# Patient Record
Sex: Female | Born: 1935 | Race: Black or African American | Hispanic: No | Marital: Married | Smoking: Never smoker
Health system: Southern US, Community
[De-identification: ages and names within clinical notes are randomized; demographics above are authoritative.]

## PROBLEM LIST (undated history)

## (undated) DIAGNOSIS — I10 Essential (primary) hypertension: Secondary | ICD-10-CM

## (undated) DIAGNOSIS — I5022 Chronic systolic (congestive) heart failure: Secondary | ICD-10-CM

## (undated) DIAGNOSIS — R809 Proteinuria, unspecified: Secondary | ICD-10-CM

## (undated) DIAGNOSIS — M8580 Other specified disorders of bone density and structure, unspecified site: Secondary | ICD-10-CM

## (undated) DIAGNOSIS — I482 Chronic atrial fibrillation, unspecified: Secondary | ICD-10-CM

## (undated) DIAGNOSIS — E559 Vitamin D deficiency, unspecified: Secondary | ICD-10-CM

## (undated) DIAGNOSIS — D869 Sarcoidosis, unspecified: Secondary | ICD-10-CM

## (undated) DIAGNOSIS — E278 Other specified disorders of adrenal gland: Secondary | ICD-10-CM

## (undated) DIAGNOSIS — J45909 Unspecified asthma, uncomplicated: Secondary | ICD-10-CM

## (undated) HISTORY — PX: EYE SURGERY: SHX253

## (undated) HISTORY — DX: Vitamin D deficiency, unspecified: E55.9

## (undated) HISTORY — DX: Essential (primary) hypertension: I10

## (undated) HISTORY — PX: ABDOMINAL HYSTERECTOMY: SHX81

## (undated) HISTORY — DX: Unspecified asthma, uncomplicated: J45.909

## (undated) HISTORY — DX: Other specified disorders of adrenal gland: E27.8

## (undated) HISTORY — DX: Sarcoidosis, unspecified: D86.9

## (undated) HISTORY — DX: Other specified disorders of bone density and structure, unspecified site: M85.80

## (undated) HISTORY — DX: Proteinuria, unspecified: R80.9

---

## 2003-11-02 ENCOUNTER — Other Ambulatory Visit: Payer: Self-pay

## 2009-12-24 ENCOUNTER — Ambulatory Visit: Payer: Self-pay | Admitting: Family Medicine

## 2011-02-06 ENCOUNTER — Ambulatory Visit: Payer: Self-pay | Admitting: Family Medicine

## 2012-02-09 ENCOUNTER — Ambulatory Visit: Payer: Self-pay | Admitting: Family Medicine

## 2012-07-15 ENCOUNTER — Inpatient Hospital Stay: Payer: Self-pay | Admitting: Internal Medicine

## 2012-07-15 LAB — CK TOTAL AND CKMB (NOT AT ARMC)
CK, Total: 54 U/L (ref 21–215)
CK-MB: <0.5 ng/mL — ABNORMAL LOW (ref 0.5–3.6)

## 2012-07-15 LAB — COMPREHENSIVE METABOLIC PANEL
Albumin: 4.4 g/dL (ref 3.4–5.0)
Anion Gap: 5 — ABNORMAL LOW (ref 7–16)
Calcium, Total: 9.5 mg/dL (ref 8.5–10.1)
Chloride: 105 mmol/L (ref 98–107)
Co2: 28 mmol/L (ref 21–32)
Creatinine: 0.97 mg/dL (ref 0.60–1.30)
EGFR (Non-African Amer.): 57 — ABNORMAL LOW
Glucose: 141 mg/dL — ABNORMAL HIGH (ref 65–99)
Potassium: 3.8 mmol/L (ref 3.5–5.1)
SGOT(AST): 28 U/L (ref 15–37)
SGPT (ALT): 31 U/L (ref 12–78)

## 2012-07-15 LAB — TSH: Thyroid Stimulating Horm: 2.39 u[IU]/mL

## 2012-07-15 LAB — PRO B NATRIURETIC PEPTIDE: B-Type Natriuretic Peptide: 1693 pg/mL — ABNORMAL HIGH (ref 0–450)

## 2012-07-15 LAB — CBC
HGB: 14.9 g/dL (ref 12.0–16.0)
MCH: 29.9 pg (ref 26.0–34.0)
MCHC: 33.6 g/dL (ref 32.0–36.0)
MCV: 89 fL (ref 80–100)
RBC: 4.97 10*6/uL (ref 3.80–5.20)
RDW: 15 % — ABNORMAL HIGH (ref 11.5–14.5)
WBC: 10.1 10*3/uL (ref 3.6–11.0)

## 2012-07-16 LAB — CBC WITH DIFFERENTIAL/PLATELET
Basophil #: 0.1 10*3/uL (ref 0.0–0.1)
Basophil %: 0.6 %
Eosinophil #: 0.3 10*3/uL (ref 0.0–0.7)
HCT: 38.2 % (ref 35.0–47.0)
HGB: 12.8 g/dL (ref 12.0–16.0)
Lymphocyte #: 2.2 10*3/uL (ref 1.0–3.6)
MCHC: 33.5 g/dL (ref 32.0–36.0)
MCV: 88 fL (ref 80–100)
Neutrophil %: 60.7 %
RDW: 15 % — ABNORMAL HIGH (ref 11.5–14.5)
WBC: 9.9 10*3/uL (ref 3.6–11.0)

## 2012-07-16 LAB — LIPID PANEL
HDL Cholesterol: 41 mg/dL (ref 40–60)
Ldl Cholesterol, Calc: 71 mg/dL (ref 0–100)
Triglycerides: 104 mg/dL (ref 0–200)
VLDL Cholesterol, Calc: 21 mg/dL (ref 5–40)

## 2012-07-17 LAB — BASIC METABOLIC PANEL
Anion Gap: 3 — ABNORMAL LOW (ref 7–16)
BUN: 22 mg/dL — ABNORMAL HIGH (ref 7–18)
Calcium, Total: 8.9 mg/dL (ref 8.5–10.1)
Chloride: 98 mmol/L (ref 98–107)
Co2: 34 mmol/L — ABNORMAL HIGH (ref 21–32)
Creatinine: 1.05 mg/dL (ref 0.60–1.30)

## 2013-01-13 ENCOUNTER — Ambulatory Visit: Payer: Self-pay | Admitting: Family Medicine

## 2013-02-09 ENCOUNTER — Ambulatory Visit: Payer: Self-pay | Admitting: Family Medicine

## 2013-03-01 ENCOUNTER — Ambulatory Visit: Payer: Self-pay | Admitting: Family Medicine

## 2013-05-01 ENCOUNTER — Emergency Department: Payer: Self-pay | Admitting: Emergency Medicine

## 2013-05-01 LAB — BASIC METABOLIC PANEL
Anion Gap: 5 — ABNORMAL LOW (ref 7–16)
Calcium, Total: 9.5 mg/dL (ref 8.5–10.1)
Chloride: 105 mmol/L (ref 98–107)
Co2: 27 mmol/L (ref 21–32)
Creatinine: 0.87 mg/dL (ref 0.60–1.30)
EGFR (African American): >60
EGFR (Non-African Amer.): >60
Glucose: 118 mg/dL — ABNORMAL HIGH (ref 65–99)
Sodium: 137 mmol/L (ref 136–145)

## 2013-05-01 LAB — CBC
HGB: 14.1 g/dL (ref 12.0–16.0)
MCH: 29.6 pg (ref 26.0–34.0)
MCHC: 33.2 g/dL (ref 32.0–36.0)
MCV: 89 fL (ref 80–100)
WBC: 11.3 10*3/uL — ABNORMAL HIGH (ref 3.6–11.0)

## 2013-11-08 ENCOUNTER — Inpatient Hospital Stay: Payer: Self-pay | Admitting: Internal Medicine

## 2013-11-08 LAB — CBC
HCT: 38.9 % (ref 35.0–47.0)
HGB: 12.5 g/dL (ref 12.0–16.0)
MCH: 28 pg (ref 26.0–34.0)
MCHC: 32 g/dL (ref 32.0–36.0)
MCV: 88 fL (ref 80–100)
Platelet: 249 10*3/uL (ref 150–440)
RBC: 4.45 10*6/uL (ref 3.80–5.20)
RDW: 14.4 % (ref 11.5–14.5)
WBC: 23 10*3/uL — ABNORMAL HIGH (ref 3.6–11.0)

## 2013-11-08 LAB — PRO B NATRIURETIC PEPTIDE: B-Type Natriuretic Peptide: 2982 pg/mL — ABNORMAL HIGH (ref 0–450)

## 2013-11-08 LAB — TROPONIN I: Troponin-I: <0.02 ng/mL

## 2013-11-08 LAB — BASIC METABOLIC PANEL
Anion Gap: 8 (ref 7–16)
BUN: 21 mg/dL — ABNORMAL HIGH (ref 7–18)
CHLORIDE: 92 mmol/L — AB (ref 98–107)
Calcium, Total: 9 mg/dL (ref 8.5–10.1)
Co2: 26 mmol/L (ref 21–32)
Creatinine: 1.08 mg/dL (ref 0.60–1.30)
EGFR (African American): 57 — ABNORMAL LOW
EGFR (Non-African Amer.): 49 — ABNORMAL LOW
Glucose: 240 mg/dL — ABNORMAL HIGH (ref 65–99)
Osmolality: 264 (ref 275–301)
POTASSIUM: 3.6 mmol/L (ref 3.5–5.1)
SODIUM: 126 mmol/L — AB (ref 136–145)

## 2013-11-08 LAB — CK TOTAL AND CKMB (NOT AT ARMC): CK, Total: 70 U/L

## 2013-11-09 LAB — URINALYSIS, COMPLETE
Bilirubin,UR: NEGATIVE
GLUCOSE, UR: NEGATIVE mg/dL (ref 0–75)
KETONE: NEGATIVE
LEUKOCYTE ESTERASE: NEGATIVE
Nitrite: NEGATIVE
Ph: 5 (ref 4.5–8.0)
Protein: NEGATIVE
RBC,UR: 2 /HPF (ref 0–5)
Specific Gravity: 1.011 (ref 1.003–1.030)
Squamous Epithelial: 1

## 2013-11-09 LAB — TROPONIN I: Troponin-I: <0.02 ng/mL

## 2013-11-10 LAB — BASIC METABOLIC PANEL
Anion Gap: 7 (ref 7–16)
BUN: 36 mg/dL — AB (ref 7–18)
CALCIUM: 9 mg/dL (ref 8.5–10.1)
Chloride: 90 mmol/L — ABNORMAL LOW (ref 98–107)
Co2: 33 mmol/L — ABNORMAL HIGH (ref 21–32)
Creatinine: 1.11 mg/dL (ref 0.60–1.30)
EGFR (African American): 55 — ABNORMAL LOW
EGFR (Non-African Amer.): 48 — ABNORMAL LOW
Glucose: 112 mg/dL — ABNORMAL HIGH (ref 65–99)
Osmolality: 270 (ref 275–301)
POTASSIUM: 2.8 mmol/L — AB (ref 3.5–5.1)
SODIUM: 130 mmol/L — AB (ref 136–145)

## 2013-11-10 LAB — CBC WITH DIFFERENTIAL/PLATELET
Basophil #: 0.1 10*3/uL (ref 0.0–0.1)
Basophil %: 0.3 %
EOS ABS: 0.1 10*3/uL (ref 0.0–0.7)
EOS PCT: 0.5 %
HCT: 36.5 % (ref 35.0–47.0)
HGB: 12.2 g/dL (ref 12.0–16.0)
LYMPHS ABS: 1.6 10*3/uL (ref 1.0–3.6)
Lymphocyte %: 7.8 %
MCH: 30.1 pg (ref 26.0–34.0)
MCHC: 33.5 g/dL (ref 32.0–36.0)
MCV: 90 fL (ref 80–100)
MONO ABS: 2.5 x10 3/mm — AB (ref 0.2–0.9)
Monocyte %: 11.9 %
NEUTROS PCT: 79.5 %
Neutrophil #: 16.4 10*3/uL — ABNORMAL HIGH (ref 1.4–6.5)
Platelet: 253 10*3/uL (ref 150–440)
RBC: 4.07 10*6/uL (ref 3.80–5.20)
RDW: 14.3 % (ref 11.5–14.5)
WBC: 20.6 10*3/uL — ABNORMAL HIGH (ref 3.6–11.0)

## 2013-11-10 LAB — MAGNESIUM: Magnesium: 1.8 mg/dL

## 2013-11-11 LAB — CBC WITH DIFFERENTIAL/PLATELET
BASOS ABS: 0 10*3/uL (ref 0.0–0.1)
Basophil %: 0.1 %
EOS PCT: 0.8 %
Eosinophil #: 0.1 10*3/uL (ref 0.0–0.7)
HCT: 36.3 % (ref 35.0–47.0)
HGB: 11.9 g/dL — ABNORMAL LOW (ref 12.0–16.0)
LYMPHS PCT: 8.5 %
Lymphocyte #: 1.5 10*3/uL (ref 1.0–3.6)
MCH: 29.1 pg (ref 26.0–34.0)
MCHC: 32.7 g/dL (ref 32.0–36.0)
MCV: 89 fL (ref 80–100)
Monocyte #: 2.1 x10 3/mm — ABNORMAL HIGH (ref 0.2–0.9)
Monocyte %: 12.1 %
Neutrophil #: 13.7 10*3/uL — ABNORMAL HIGH (ref 1.4–6.5)
Neutrophil %: 78.5 %
PLATELETS: 286 10*3/uL (ref 150–440)
RBC: 4.08 10*6/uL (ref 3.80–5.20)
RDW: 14.1 % (ref 11.5–14.5)
WBC: 17.5 10*3/uL — AB (ref 3.6–11.0)

## 2013-11-11 LAB — BASIC METABOLIC PANEL
ANION GAP: 6 — AB (ref 7–16)
BUN: 34 mg/dL — ABNORMAL HIGH (ref 7–18)
CO2: 31 mmol/L (ref 21–32)
Calcium, Total: 8.7 mg/dL (ref 8.5–10.1)
Chloride: 94 mmol/L — ABNORMAL LOW (ref 98–107)
Creatinine: 0.97 mg/dL (ref 0.60–1.30)
GFR CALC NON AF AMER: 56 — AB
Glucose: 125 mg/dL — ABNORMAL HIGH (ref 65–99)
OSMOLALITY: 272 (ref 275–301)
POTASSIUM: 3.8 mmol/L (ref 3.5–5.1)
Sodium: 131 mmol/L — ABNORMAL LOW (ref 136–145)

## 2013-11-11 LAB — MAGNESIUM: MAGNESIUM: 2.3 mg/dL

## 2013-11-12 LAB — CBC WITH DIFFERENTIAL/PLATELET
Basophil #: 0 10*3/uL (ref 0.0–0.1)
Basophil %: 0.2 %
EOS PCT: 0.9 %
Eosinophil #: 0.1 10*3/uL (ref 0.0–0.7)
HCT: 36.1 % (ref 35.0–47.0)
HGB: 12.1 g/dL (ref 12.0–16.0)
LYMPHS ABS: 1.4 10*3/uL (ref 1.0–3.6)
Lymphocyte %: 8.5 %
MCH: 29.4 pg (ref 26.0–34.0)
MCHC: 33.6 g/dL (ref 32.0–36.0)
MCV: 87 fL (ref 80–100)
MONOS PCT: 11.2 %
Monocyte #: 1.8 x10 3/mm — ABNORMAL HIGH (ref 0.2–0.9)
NEUTROS ABS: 13 10*3/uL — AB (ref 1.4–6.5)
Neutrophil %: 79.2 %
Platelet: 283 10*3/uL (ref 150–440)
RBC: 4.13 10*6/uL (ref 3.80–5.20)
RDW: 14.2 % (ref 11.5–14.5)
WBC: 16.4 10*3/uL — ABNORMAL HIGH (ref 3.6–11.0)

## 2013-11-12 LAB — BASIC METABOLIC PANEL
ANION GAP: 7 (ref 7–16)
BUN: 33 mg/dL — ABNORMAL HIGH (ref 7–18)
Calcium, Total: 8.8 mg/dL (ref 8.5–10.1)
Chloride: 90 mmol/L — ABNORMAL LOW (ref 98–107)
Co2: 32 mmol/L (ref 21–32)
Creatinine: 0.85 mg/dL (ref 0.60–1.30)
GLUCOSE: 114 mg/dL — AB (ref 65–99)
OSMOLALITY: 267 (ref 275–301)
POTASSIUM: 3.9 mmol/L (ref 3.5–5.1)
Sodium: 129 mmol/L — ABNORMAL LOW (ref 136–145)

## 2013-11-13 LAB — CBC WITH DIFFERENTIAL/PLATELET
BASOS ABS: 0.1 10*3/uL (ref 0.0–0.1)
Basophil %: 0.3 %
Eosinophil #: 0.1 10*3/uL (ref 0.0–0.7)
Eosinophil %: 0.7 %
HCT: 38.4 % (ref 35.0–47.0)
HGB: 12.3 g/dL (ref 12.0–16.0)
LYMPHS PCT: 7.9 %
Lymphocyte #: 1.4 10*3/uL (ref 1.0–3.6)
MCH: 28.3 pg (ref 26.0–34.0)
MCHC: 32.1 g/dL (ref 32.0–36.0)
MCV: 88 fL (ref 80–100)
MONO ABS: 2 x10 3/mm — AB (ref 0.2–0.9)
MONOS PCT: 11.4 %
Neutrophil #: 13.9 10*3/uL — ABNORMAL HIGH (ref 1.4–6.5)
Neutrophil %: 79.7 %
Platelet: 347 10*3/uL (ref 150–440)
RBC: 4.35 10*6/uL (ref 3.80–5.20)
RDW: 14.2 % (ref 11.5–14.5)
WBC: 17.5 10*3/uL — ABNORMAL HIGH (ref 3.6–11.0)

## 2013-11-13 LAB — BASIC METABOLIC PANEL
Anion Gap: 4 — ABNORMAL LOW (ref 7–16)
BUN: 25 mg/dL — ABNORMAL HIGH (ref 7–18)
Calcium, Total: 9 mg/dL (ref 8.5–10.1)
Chloride: 90 mmol/L — ABNORMAL LOW (ref 98–107)
Co2: 33 mmol/L — ABNORMAL HIGH (ref 21–32)
Creatinine: 0.96 mg/dL (ref 0.60–1.30)
EGFR (African American): >60
EGFR (Non-African Amer.): 57 — ABNORMAL LOW
Glucose: 124 mg/dL — ABNORMAL HIGH (ref 65–99)
Osmolality: 261 (ref 275–301)
Potassium: 4.1 mmol/L (ref 3.5–5.1)
Sodium: 127 mmol/L — ABNORMAL LOW (ref 136–145)

## 2013-11-13 LAB — CULTURE, BLOOD (SINGLE)

## 2013-11-14 LAB — CBC WITH DIFFERENTIAL/PLATELET
Basophil #: 0.1 10*3/uL (ref 0.0–0.1)
Basophil %: 0.3 %
EOS PCT: 1.5 %
Eosinophil #: 0.3 10*3/uL (ref 0.0–0.7)
HCT: 35.9 % (ref 35.0–47.0)
HGB: 12 g/dL (ref 12.0–16.0)
Lymphocyte #: 1.2 10*3/uL (ref 1.0–3.6)
Lymphocyte %: 7 %
MCH: 29 pg (ref 26.0–34.0)
MCHC: 33.4 g/dL (ref 32.0–36.0)
MCV: 87 fL (ref 80–100)
MONO ABS: 2.5 x10 3/mm — AB (ref 0.2–0.9)
Monocyte %: 14.2 %
Neutrophil #: 13.6 10*3/uL — ABNORMAL HIGH (ref 1.4–6.5)
Neutrophil %: 77 %
PLATELETS: 328 10*3/uL (ref 150–440)
RBC: 4.14 10*6/uL (ref 3.80–5.20)
RDW: 14.2 % (ref 11.5–14.5)
WBC: 17.7 10*3/uL — ABNORMAL HIGH (ref 3.6–11.0)

## 2013-11-14 LAB — BASIC METABOLIC PANEL
Anion Gap: 7 (ref 7–16)
BUN: 23 mg/dL — ABNORMAL HIGH (ref 7–18)
CHLORIDE: 89 mmol/L — AB (ref 98–107)
CREATININE: 0.93 mg/dL (ref 0.60–1.30)
Calcium, Total: 8.7 mg/dL (ref 8.5–10.1)
Co2: 32 mmol/L (ref 21–32)
EGFR (African American): >60
EGFR (Non-African Amer.): 59 — ABNORMAL LOW
Glucose: 109 mg/dL — ABNORMAL HIGH (ref 65–99)
Osmolality: 261 (ref 275–301)
Potassium: 3.9 mmol/L (ref 3.5–5.1)
Sodium: 128 mmol/L — ABNORMAL LOW (ref 136–145)

## 2013-11-14 LAB — BODY FLUID CELL COUNT WITH DIFFERENTIAL
BASOS ABS: 0 %
EOS PCT: 0 %
LYMPHS PCT: 63 %
NUCLEATED CELL COUNT: 6212 /mm3
Neutrophils: 30 %
OTHER CELLS BF: 0 %
Other Mononuclear Cells: 7 %

## 2013-11-14 LAB — LACTATE DEHYDROGENASE, PLEURAL OR PERITONEAL FLUID: LDH, Body Fluid: 107 U/L

## 2013-11-14 LAB — MAGNESIUM: MAGNESIUM: 2.3 mg/dL

## 2013-11-15 LAB — CBC WITH DIFFERENTIAL/PLATELET
Basophil #: 0 10*3/uL (ref 0.0–0.1)
Basophil %: 0.2 %
Eosinophil #: 0.3 10*3/uL (ref 0.0–0.7)
Eosinophil %: 2 %
HCT: 38.4 % (ref 35.0–47.0)
HGB: 12 g/dL (ref 12.0–16.0)
Lymphocyte #: 1.2 10*3/uL (ref 1.0–3.6)
Lymphocyte %: 7 %
MCH: 27.6 pg (ref 26.0–34.0)
MCHC: 31.4 g/dL — AB (ref 32.0–36.0)
MCV: 88 fL (ref 80–100)
Monocyte #: 1.8 x10 3/mm — ABNORMAL HIGH (ref 0.2–0.9)
Monocyte %: 10.7 %
Neutrophil #: 13.3 10*3/uL — ABNORMAL HIGH (ref 1.4–6.5)
Neutrophil %: 80.1 %
Platelet: 359 10*3/uL (ref 150–440)
RBC: 4.36 10*6/uL (ref 3.80–5.20)
RDW: 14.3 % (ref 11.5–14.5)
WBC: 16.7 10*3/uL — ABNORMAL HIGH (ref 3.6–11.0)

## 2013-11-15 LAB — BASIC METABOLIC PANEL
Anion Gap: 6 — ABNORMAL LOW (ref 7–16)
BUN: 22 mg/dL — AB (ref 7–18)
CO2: 33 mmol/L — AB (ref 21–32)
CREATININE: 1.06 mg/dL (ref 0.60–1.30)
Calcium, Total: 8.8 mg/dL (ref 8.5–10.1)
Chloride: 88 mmol/L — ABNORMAL LOW (ref 98–107)
EGFR (African American): 59 — ABNORMAL LOW
GFR CALC NON AF AMER: 51 — AB
GLUCOSE: 207 mg/dL — AB (ref 65–99)
Osmolality: 265 (ref 275–301)
POTASSIUM: 3.9 mmol/L (ref 3.5–5.1)
Sodium: 127 mmol/L — ABNORMAL LOW (ref 136–145)

## 2013-11-18 LAB — BODY FLUID CULTURE

## 2014-01-02 ENCOUNTER — Ambulatory Visit: Payer: Self-pay | Admitting: Internal Medicine

## 2014-03-14 IMAGING — MG MM DIGITAL SCREENING BILAT W/ CAD
1 series · 4 of 4 positions shown · non-contrast
Comparison: Previous exam(s)

REASON FOR EXAM: SCR MAMMO NO ORDER
COMMENTS:

PROCEDURE:     MAM - MAM DGTL SCRN MAM NO ORDER W/CAD  - February 09, 2013 [DATE]
CLINICAL DATA: Screening.
EXAM:
DIGITAL SCREENING BILATERAL MAMMOGRAM WITH CAD

[R CC · right · 4 of 4 slices shown]
[im 1/4]
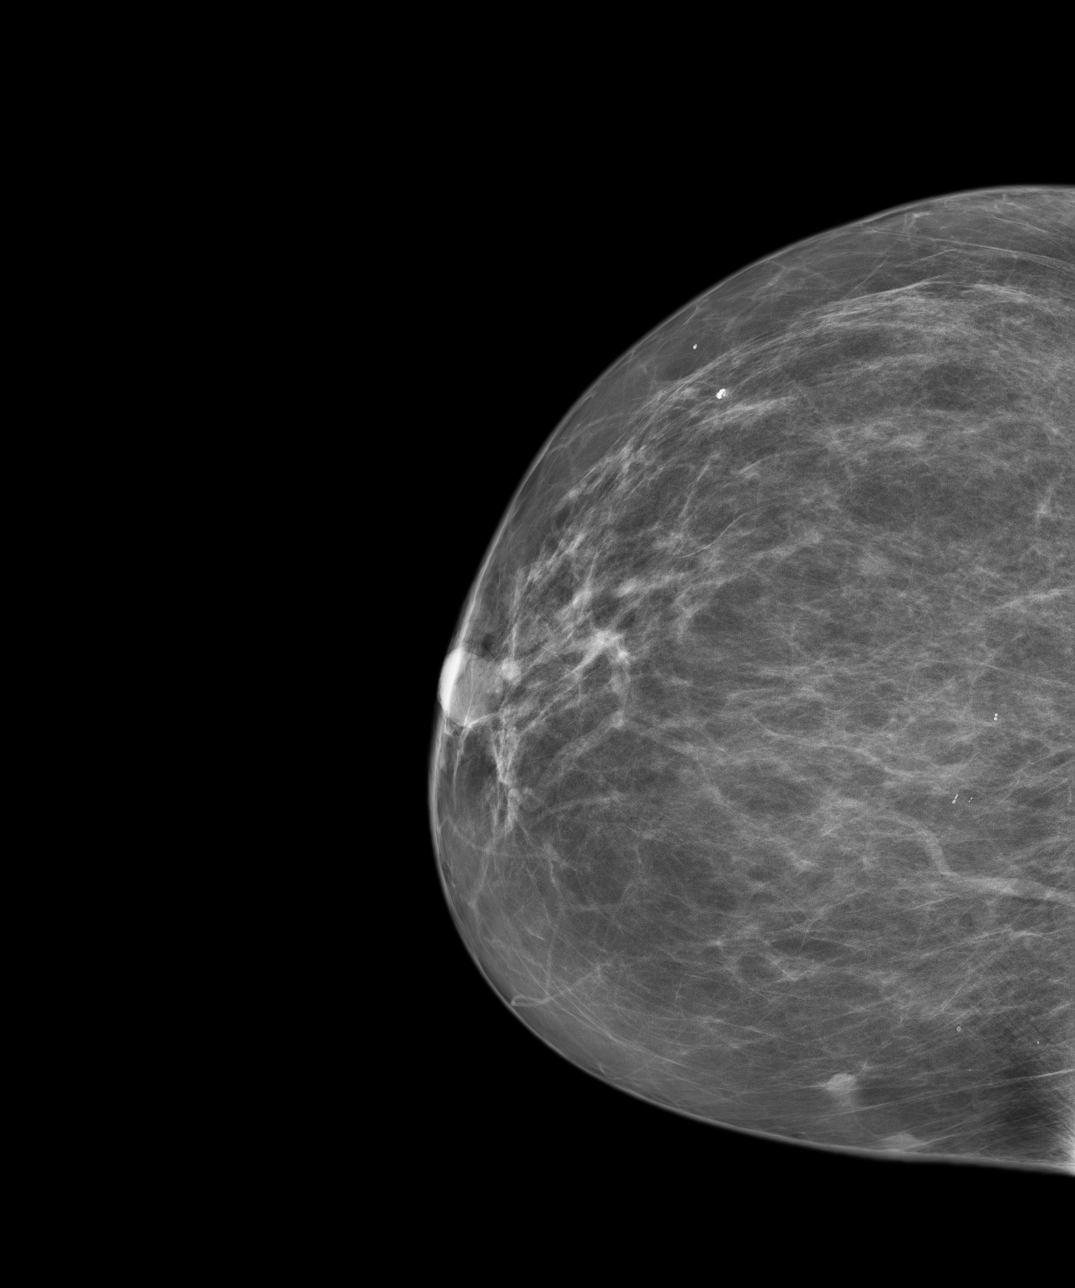
[im 2/4]
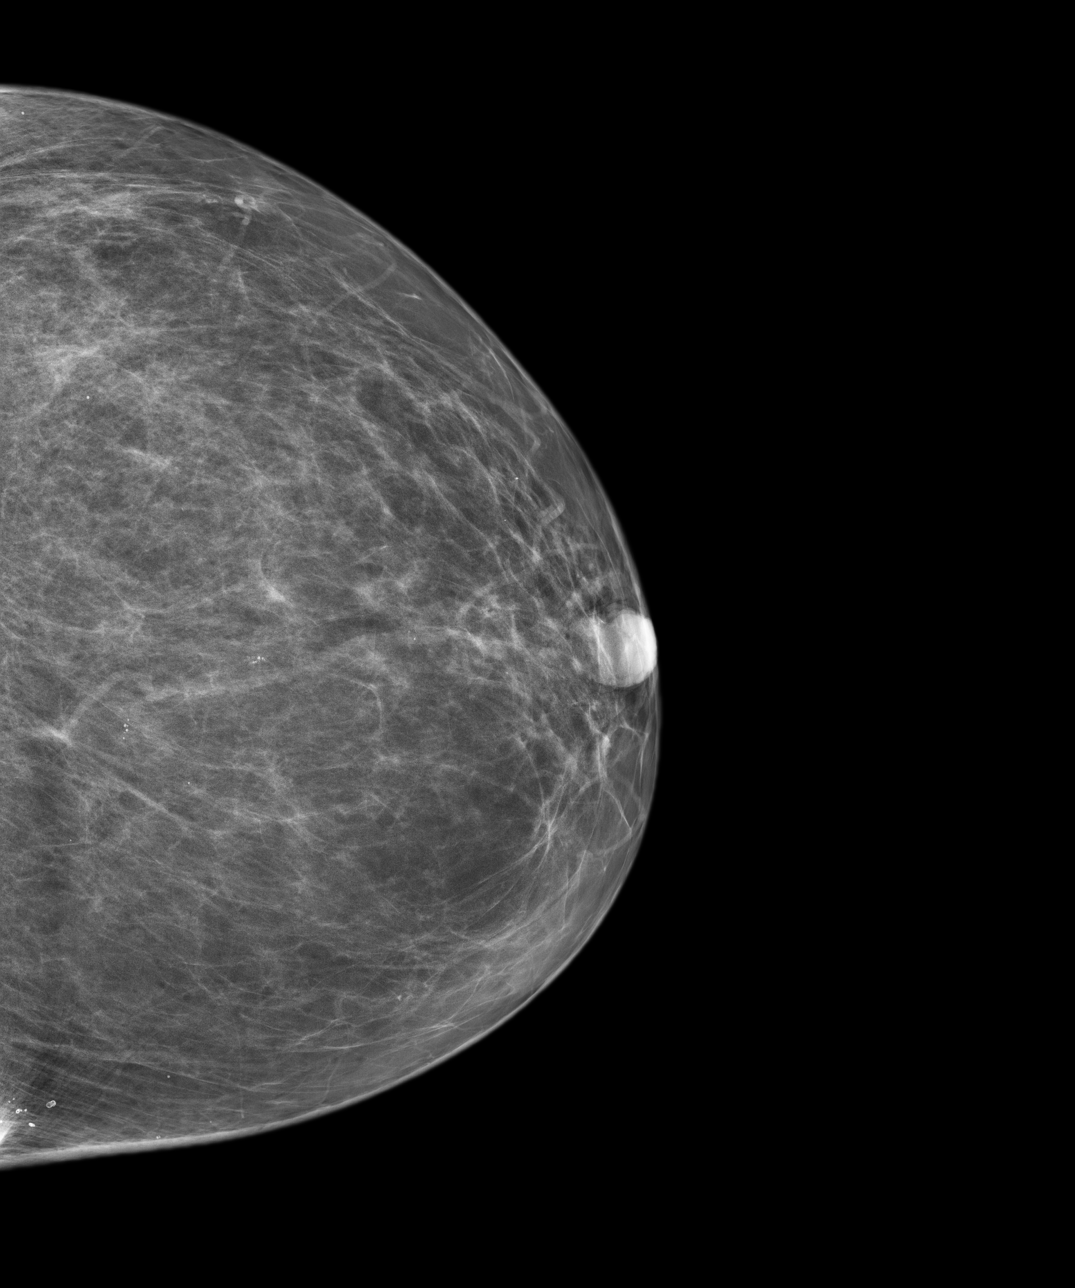
[im 3/4]
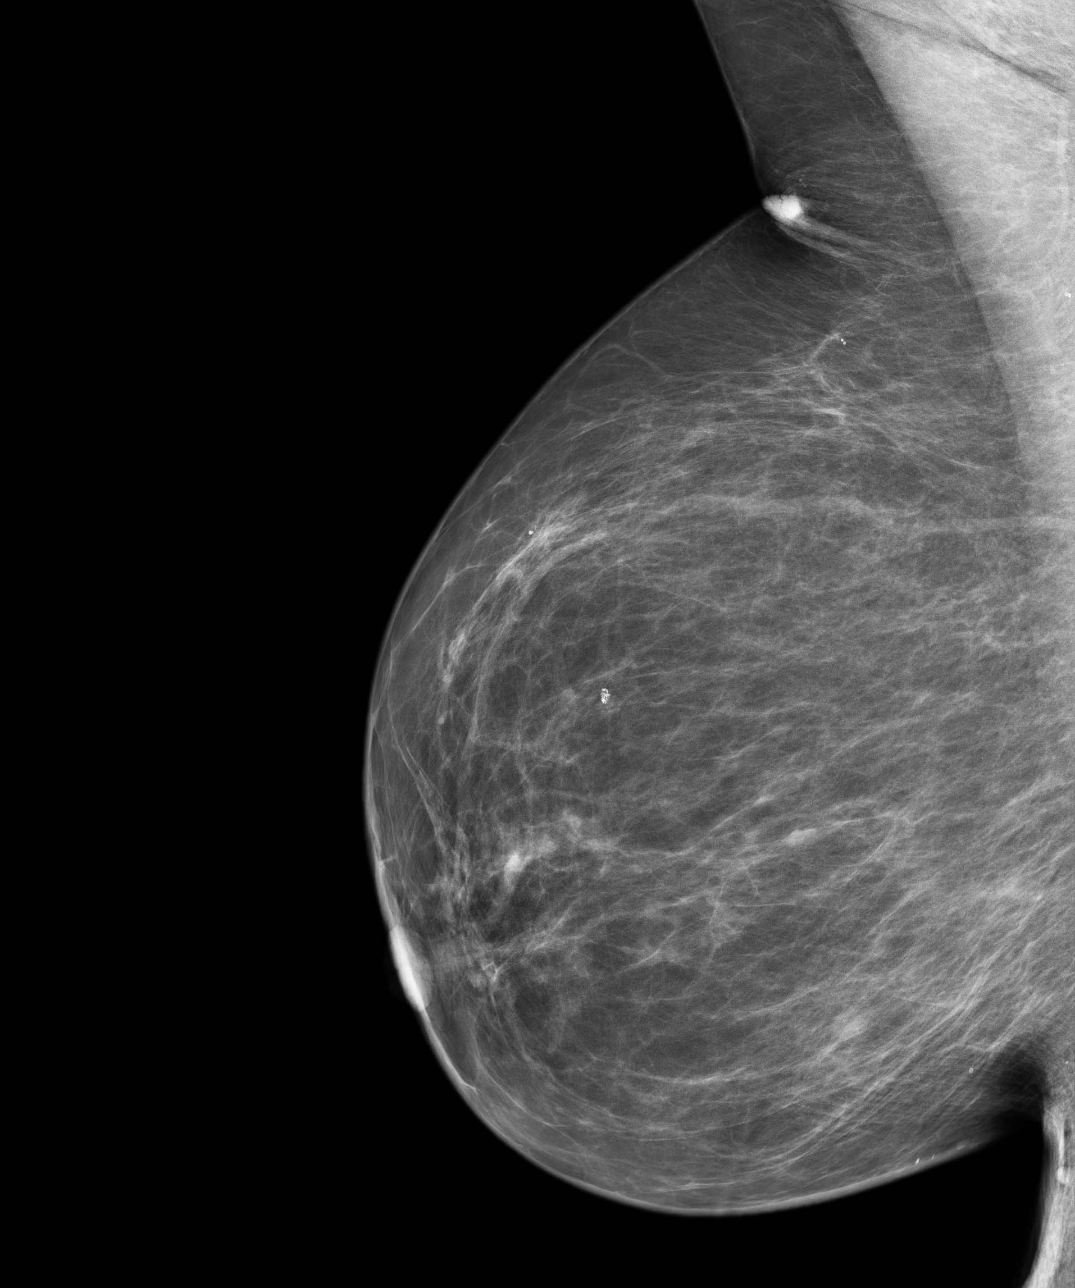
[im 4/4]
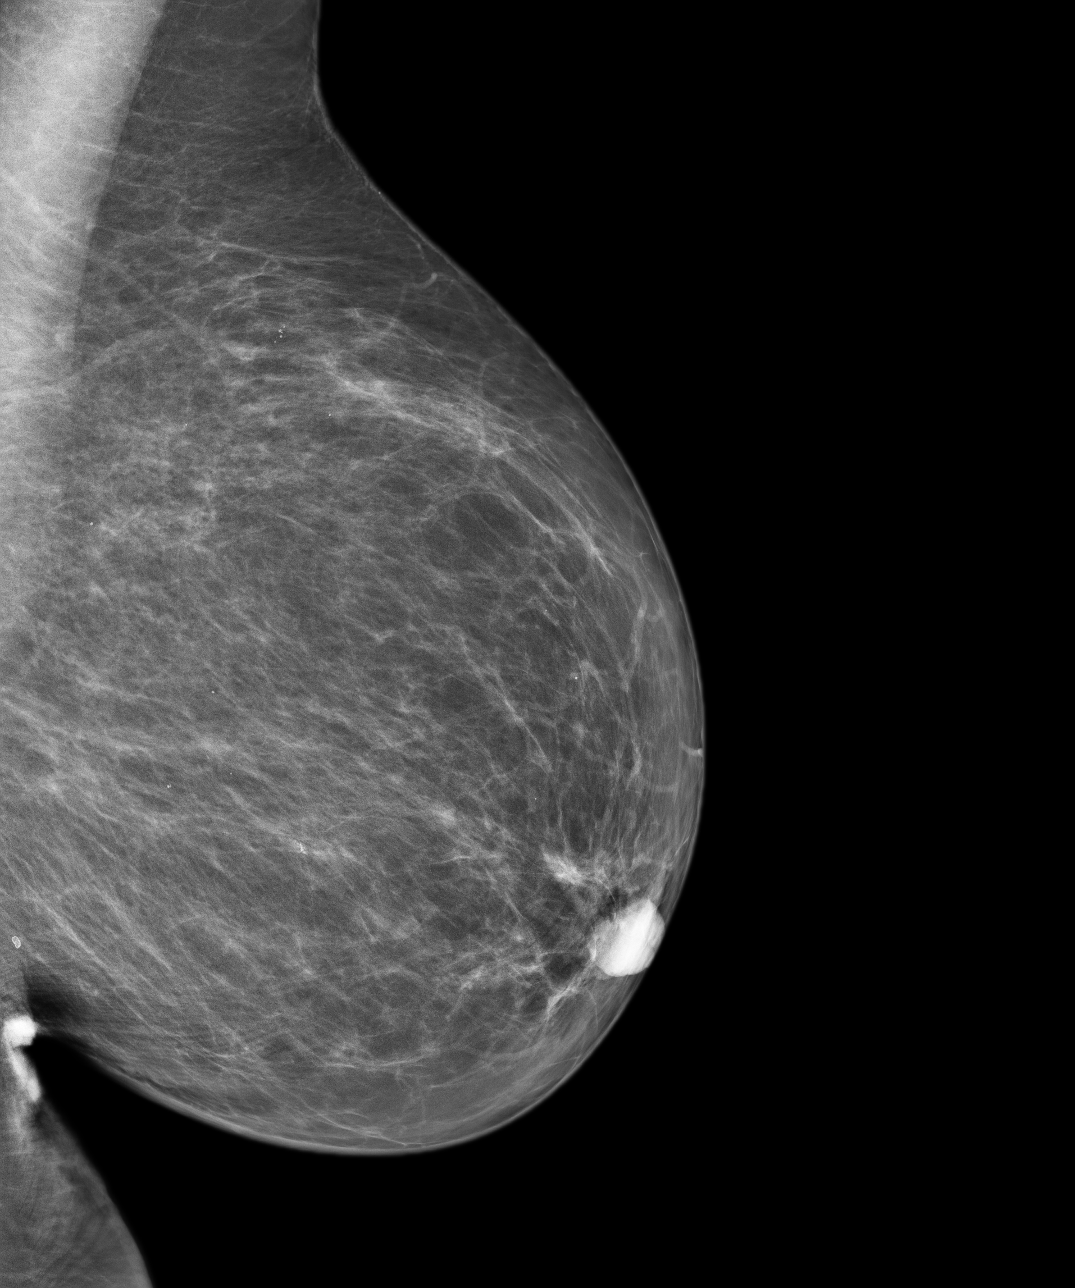

[4 of 4 positions shown; findings below may reference images not displayed]

ACR Breast Density Category c: The breasts are heterogeneously
dense, which may obscure small masses
FINDINGS: In the right breast, a possible mass warrants further evaluation
with spot compression views and possibly ultrasound. In the left
breast, no masses or malignant type calcifications are identified.
Images were processed with CAD.
IMPRESSION: Further evaluation is suggested for possible mass in the right
breast.

RECOMMENDATION:
Diagnostic mammogram and possibly ultrasound of the right breast.
(Code:78-A-66U)

The patient will be contacted regarding the findings, and additional
imaging will be scheduled.

BI-RADS CATEGORY  0: Incomplete. Need additional imaging evaluation
and/or prior mammograms for comparison.

## 2014-04-19 ENCOUNTER — Ambulatory Visit: Payer: Self-pay | Admitting: Family Medicine

## 2014-08-25 NOTE — Discharge Summary (Signed)
PATIENT NAME:  Julie Tate, Julie Tate MR#:  [id number] DATE OF BIRTH:  August 26, 1934  DATE OF ADMISSION:  07/15/2012 DATE OF DISCHARGE:  07/17/2012  ADMITTING DIAGNOSIS: Shortness of breath.   DISCHARGE DIAGNOSES:  1.  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 Her symptoms significantly improved.  2.  Paroxysmal atrial fibrillation, also a new diagnosis for the patient. The patient is started on Pradaxa. She will need outpatient cardiology followup and a stress test for ischemic workup.  3.  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 She will need outpatient followup.  4.  Hypertension.  5.  History of asthma without any evidence of acute exacerbation.  CONSULTANTS: Dr. Juel BurrowMasoud.  PERTINENT LABS AND EVALUATIONS: Admitting glucose 141, BNP was 1693, BUN 13, creatinine 0.97, sodium 138, potassium 3.8, chloride 105, CO2 28. ALT 31, AST 28, alkaline phosphatase 105. Troponin less than 0.02. WBC count 10.1, hemoglobin 14.9, platelet count was 216. EKG showed atrial fibrillation with ventricular rate of 97. CT scan of the chest with IV contrast showed no PE, findings consistent with CHF, indeterminate left adrenal nodule. The patient's TSH was 2.39,   Metanephrines for 24 hours are pending. Echocardiogram of the heart showed left ventricular ejection fraction of 45% to 50%, impaired relaxation pattern of left ventricular diastolic filling and mild increased left ventricular posterior wall thickness. Lipid panel: Total cholesterol 133, triglyceride 104, HDL 41, LDL 71.   HOSPITAL COURSE: Please refer to H and P done by the admitting physician. The patient is a 80 year old female with past medical history significant for hypertension and asthma, who presented with shortness of breath. The patient also complained of swelling in her lower extremity, nocturnal dyspnea and orthopnea. The patient was seen in the ED and noted to  have elevated BNP as well as a chest x-ray, which suggested acute CHF. The patient also was noted to be in atrial fibrillation with RVR. Due to these symptoms, she was admitted to the hospital. In terms of her complaints of acute respiratory failure, this was thought to be due to acute systolic and diastolic CHF. The patient was treated with IV Lasix with good urine output. Now, she is doing much better and is asymptomatic from a respiratory standpoint. The patient also was noted to be in atrial fibrillation. She was having paroxysmal atrial fibrillation. She would convert to sinus and then go back in atrial fibrillation. This is also a new diagnosis. The patient was feeling palpitations at home. She was seen by cardiology and they recommended starting her on Pradaxa. At this time, she is doing much better. Heart rate is stable. She is feeling much better and is stable for discharge.   DISCHARGE MEDICATIONS: Albuterol MDI as previously, Zyrtec 1 tab p.o. daily, carvedilol 25 mg 1 tab p.o. b.i.Tate., diltiazem 180 mg daily, aspirin 81 mg 1 tab p.o. daily, Pradaxa 75 mg 1 tab p.o. b.i.Tate., Lasix 40 mg 1 tab p.o. b.i.Tate. and potassium 10 mEq 1 tab p.o. daily.   HOME OXYGEN: None.   DIET: Low sodium.   ACTIVITY: As tolerated.   FOLLOWUP: With primary physician in 1 to 2 weeks. Dr. Welton FlakesKhan in 2 to 4 weeks of cardiology for atrial fibrillation.   TIME SPENT: 35 minutes.    ____________________________ Lacie ScottsShreyang H. Allena KatzPatel, MD shp:aw Tate: 07/17/2012 13:34:02 ET T: 07/18/2012 07:21:36 ET JOB#: [id number]  cc: Finnegan Gatta H. Allena KatzPatel, MD, <Dictator> Charise CarwinSHREYANG H Coila Wardell MD ELECTRONICALLY  SIGNED 07/23/2012 11:23

## 2014-08-25 NOTE — H&P (Signed)
PATIENT NAME:  Julie Tate, Julie Tate MR#:  [id number] DATE OF BIRTH:  [birth year]  DATE OF ADMISSION:  07/15/2012  REFERRING PHYSICIAN: Marjean Donna, MD  PRIMARY CARE PHYSICIAN: Golden Pop, MD  CHIEF COMPLAINT: Shortness of breath.   HISTORY OF PRESENT ILLNESS: This is a 80 year old female with significant past medical history of hypertension and asthma who presents with complaint of shortness of breath. The patient reports she started to have shortness of breath over the last 3 days. Started initially as dyspnea on exertion, but it did progress gradually where her symptoms became mainly shortness of breath upon lying supine. As well had significant bilateral lower extremity edema over the last few days. She denies any such previous symptoms, denies any chest pain, any pleuritic chest pain, any fever, chills, cough, chest discomfort, any heart racing, dizziness, lightheadedness, or anxiety. The patient was found to be in A-fib with RVR, was found to be hypoxic with O2 sat 89% on room air. The patient has elevated proBNP at 1600. The patient's chest x-ray did show vascular congestion and volume overload. Given the patient's new A-fib and shortness of breath, the patient had CT of chest with IV contrast which came back negative for PE, but did show evidence of congestive heart failure with cardiomegaly and volume overload. As well the patient was found to be in A-fib in the ED.  She denies any previous history of A-fib or arrhythmia.  As well denies such symptoms of similar presentation. Hospitalist service was requested to admit the patient for further management and workup of her acute respiratory failure and new onset CHF and A-fib. The patient received IV Lasix in the ED.   PAST MEDICAL HISTORY:  1.  Hypertension.  2.  Asthma.   PAST SURGICAL HISTORY: 1.  Hysterectomy.  2.  Finger surgery.   ALLERGIES: No known drug allergies.   SOCIAL HISTORY: The patient denies any history of smoking, illicit  drug use or alcohol abuse. Currently retired. She used to do office work, retired in 2001.   FAMILY HISTORY: Has no siblings. Denies any cardiac history in any of her parents who are currently deceased.   HOME MEDICATIONS: Unfortunately, at this point, the patient cannot recall the dosage of her medication, but reports she is on baby aspirin 81 mg oral daily and albuterol as needed. She is on Cardizem CD and she is on Coreg where she cannot recall the doses of these meds.   REVIEW OF SYSTEMS: CONSTITUTIONAL:  The patient denies any fever or chills. Complains of fatigue and weakness.  EYES: Denies blurry vision, double vision, pain, inflammation.  ENT: Denies tinnitus, ear pain, hearing loss, epistaxis, difficulty swallowing.  RESPIRATORY: Denies any cough, wheezing, or hemoptysis. Has complaints of dyspnea. Denies any COPD or pneumonia. Has history of asthma.  CARDIOVASCULAR: Denies any chest pain. Has worsening lower extremity edema. Denies any arrhythmia, palpitations, syncope, lightheadedness. Has complaints of dyspnea on exertion.  GASTROINTESTINAL: Denies nausea, vomiting, diarrhea, abdominal pain, hematemesis, melena, rectal bleed, jaundice, or hemorrhoids.  GENITOURINARY: Denies dysuria, hematuria, renal colic.  ENDOCRINE:  Denies polyuria, polydipsia, heat or cold intolerance.  HEMATOLOGY: Denies anemia, easy bruising, bleeding diathesis.  INTEGUMENTARY: Denies any acne, rash or skin lesions.  MUSCULOSKELETAL: Denies neck pain, back pain, shoulder pain, hip pain, gout or cramps.  NEUROLOGIC: Denies numbness, weakness, CVA, TIA, seizures, memory loss, headache, ataxia or vertigo.  PSYCHIATRIC: Denies anxiety, schizophrenia, nervousness, substance or alcohol abuse or bipolar disorder.   PHYSICAL EXAMINATION: VITAL SIGNS: Temperature 97.6, pulse  92, respiratory rate 24, blood pressure 177/94, saturating 98% on oxygen and was 89% on room air.  GENERAL: Well-nourished female who looks  comfortable in bed in no apparent distress.  HEENT: Head atraumatic, normocephalic. Pupils are equal and reactive to light. Pink conjunctivae. Anicteric sclerae. Moist oral mucosa.  NECK: Supple. No thyromegaly. Has +6 to 7 cm JVD while leaning at 30 degrees.  CHEST: The patient has good air entry bilaterally. No wheezing, rales or rhonchi. Has bibasilar crackles.  CARDIOVASCULAR: S1, S2 heard. Irregular, irregular rhythm. Tachycardic.  ABDOMEN: Soft, nontender, nondistended. Bowel sounds present.  EXTREMITIES: +2 edema bilaterally. No clubbing. No cyanosis. Pedal pulses felt bilaterally +2.  PSYCHIATRIC: Appropriate affect. Awake and alert x 3. Intact judgment and insight present.  NEUROLOGIC: Cranial nerves grossly intact. Motor 5 out of 5 in all extremities.  SKIN: Normal skin turgor. Warm and dry.   PERTINENT LABS AND DIAGNOSTICS: Glucose 141. ProBNP 1693. BUN 13, creatinine 0.97, sodium 138, potassium 3.8, chloride 105, CO2 28, ALT 31, AST 28, alk phos 105. Troponin less than 0.02. White blood cells 10.1, hemoglobin 14.9, hematocrit 44.3, platelets 216.   EKG is showing atrial fibrillation with ventricular rate of 97.   CT of chest with IV contrast is showing no central or segmental pulmonary emboli, congestive heart failure and indeterminant left adrenal nodule.  ASSESSMENT AND PLAN: 1.  Acute respiratory failure. The patient appears to be in acute hypoxic respiratory failure with oxygen saturation 89% on room air. This is most likely due to volume overload from her congestive heart failure and atrial fibrillation. We will continue the patient on oxygen and diuresis. The patient has negative CT of chest for pulmonary embolism.   2.  Acute congestive heart failure. The patient has no previous history of congestive heart failure. This is most likely systolic heart failure by clinical presentation. Will check 2-D echocardiogram with ejection fraction. The patient is already on beta blockers. We  will consider ACE inhibitor if has reduced ejection fraction. Will admit the patient to telemetry floor. We will consult cardiology service. Will cycle troponins. The patient will be started on IV Lasix 40 mg every 12 hours. We will order initially for dosage and will reevaluate and adjust the dose.  Will keep on daily weight checks and strict ins and outs. It is unclear if her atrial fibrillation tipped her into congestive heart failure.  3.  New onset atrial fibrillation. Currently rate is acceptable, in the high 90s to the low 100s. Will continue her on diltiazem and Coreg. We will consult cardiology to decide on anticoagulation versus cardioversion, given the fact of her elevated CHADS score.  We will check TSH.  4.  Adrenal incidentaloma/adrenal mass.  We will check 24-hour urine cortisol, will check renal aldosterone ratio and will check plasma metanephrine. The patient can continue with the followup as an outpatient with her primary care physician.  5.  Hypertension. Appears to be uncontrolled. Will continue with home medications. Will adjust medications if needed.   6.  History of asthma. The patient does not have any wheezing. Will continue her on nebulizers as needed.  7.  Code status.  The patient is FULL CODE.  8.  Deep vein thrombosis prophylaxis. Subcutaneous heparin.   TOTAL TIME SPENT ON ADMISSION AND PATIENT CARE: 60 minutes.  ____________________________ Albertine Patricia, MD dse:sb D: 07/15/2012 07:50:16 ET T: 07/15/2012 08:10:34 ET JOB#: [id number]  cc: Albertine Patricia, MD, <Dictator> DAWOOD Graciela Husbands MD ELECTRONICALLY SIGNED 07/17/2012 0:20

## 2014-08-25 NOTE — Consult Note (Signed)
PATIENT NAME:  Julie Tate, Julie Tate MR#:  [id number] DATE OF BIRTH:  12-29-1934  DATE OF CONSULTATION:  07/15/2012  REFERRING PHYSICIAN:   CONSULTING PHYSICIAN:  Corky DownsJaved Clerance Umland, MD  HISTORY OF PRESENT ILLNESS:  This is a 80 year old female who was admitted to the hospital with symptoms of congestive heart failure.  The patient denies any history of chest pain, nausea, vomiting or shortness of breath.  There is no previous history of heart attack or stroke.  The patient's primary care physician is Dr. Dossie Arbourrissman, and last physical was about a year ago.  The patient can walk one block without any problems.  She does not consume a lot of salt.  There is no previous history of stroke.  She does not smoke, does not drink.  She denies any recent fever, chills, double vision.  There is no history of hemoptysis, cough, wheezing.  There is no previous history of COPD, any urinary symptoms.  She denies any history of back, shoulder pain.    PHYSICAL EXAMINATION: GENERAL:  The patient is a well-nourished female.  She is alert, cooperative, in no acute distress.  VITAL SIGNS:  Telemetry revealed patient is in atrial fibrillation, temperature 98.1, pulse rate is 105, respirations 18, blood pressure 132/82, oximetry 96%.  HEENT:  Head normocephalic.  Pupils reactive.  Sclerae nonicteric.  Tongue is moist, papillated.  NECK:  Supple.  Jugular venous pressure is not elevated.  Carotid upstroke is 2+ without any bruit.  There is no goiter.  No lymphadenopathy.  Trachea is central.  ABDOMEN:  Soft and nontender without any hepatosplenomegaly.  CARDIOVASCULAR:  Revealed both heart sounds are irregular.  No murmur is audible.   EXTREMITIES:  There is no pedal edema.  No calf tenderness.    LABORATORY DATA:  Chest x-ray shows no evidence of any congestive heart failure.  TSH 2.39, BNP 1693.  Chest CT revealed no evidence of pulmonary arterial disease, small bilateral effusions noted.  Troponin less than 0.02.  CPK 48 with a  CK-MB of less than 0.5.   IMPRESSION: 1.  Atrial fibrillation with well-controlled ventricular response.  2.  Left ventricular decompensation secondary to atrial fibrillation.    PLAN:  The patient presented to the hospital with atrial fibrillation and symptoms of congestive heart failure.  Chest x-ray does not show any pulmonary edema, but CT scan shows pleural effusion.  She does not have any previous history of heart attack, pulmonary embolism.  Troponin and CPK isoenzymes are negative so far.  Suggest to continue with Cardizem and beta-blocker.  I will suggest to start her on Pradaxa 75 mg by mouth twice a day.  We do not know how long the atrial fibrillation is, so she should be kept on Pradaxa for at least six weeks before attempting to cardiovert her to normal sinus rhythm.     ____________________________ Corky DownsJaved Kaimana Lurz, MD jm:ea Tate: 07/15/2012 19:03:35 ET T: 07/15/2012 23:30:52 ET JOB#: [id number]  cc: Corky DownsJaved Marzelle Rutten, MD, <Dictator> Corky DownsJAVED Jovonna Nickell MD ELECTRONICALLY SIGNED 08/17/2012 17:41

## 2014-08-26 NOTE — Consult Note (Signed)
PATIENT NAME:  Julie Tate MR#:  [id number] DATE OF BIRTH:  Oct 31, 1934  DATE OF CONSULTATION:  11/09/2013  PRIMARY CARE PHYSICIAN:  Julie SizerMark A. Crissman, MD.  CONSULTING PHYSICIAN:  Julie MillardAlexander Izzak Fries, MD.  CHIEF COMPLAINT: Shortness of breath.   HISTORY OF PRESENT ILLNESS: The patient is a 80 year old female with history of congestive heart failure, hypertension and asthma. She is mildly reduced left ventricular function with known LV ejection fraction of 45%. She presents with a 2-3 day history of progressive shortness of breath with exertion as well as peripheral edema and orthopnea. She denies chest pain. In the Emergency Room the patient was noted to be hypoxic and was treated with BiPAP therapy. Admission laboratory studies were notable for a negative troponin x 3.   PAST MEDICAL HISTORY:  1.  Recurrent congestive heart failure.  2.  Asthma.  3.  Hypertension.   MEDICATIONS:  Pradaxa 150 mg p.o. b.i.Tate., carvedilol 25 mg b.i.Tate., hydrochlorothiazide 25 mg daily, diltiazem 180 mg daily.   SOCIAL HISTORY: The patient denies tobacco or EtOH abuse.   FAMILY HISTORY: No immediate family history of coronary artery disease or myocardial infarction.   REVIEW OF SYSTEMS:  CONSTITUTIONAL: No fever or chills.  EYES: No blurry vision.  EARS: No hearing loss.  RESPIRATORY: Shortness of breath as described above.  CARDIOVASCULAR: The patient denies chest pain.  GASTROINTESTINAL: No nausea, vomiting, or diarrhea.  GENITOURINARY: No dysuria or hematuria.  ENDOCRINE: No polyuria or polydipsia.  MUSCULOSKELETAL: No arthralgias or myalgias.  NEUROLOGICAL: No focal muscle weakness or numbness.  PSYCHOLOGICAL: No depression or anxiety.   PHYSICAL EXAMINATION:  VITAL SIGNS: Blood pressure 147/73, pulse 88, respirations 20, temperature 97.6, pulse oximetry 94%.  HEENT: Pupils equal, reactive to light and accommodation.  NECK: Supple without thyromegaly.  LUNGS: Clear.  HEART: Normal JVP.  Normal PMI. Regular rate and rhythm. Normal S1, S2. No appreciable gallop, murmur, or rub.  ABDOMEN: Soft and nontender. Pulses were intact bilaterally.  EXTREMITIES: There is 1+ bilateral pedal edema.  MUSCULOSKELETAL: Normal muscle tone.  NEUROLOGIC: The patient is alert and oriented x3. Motor and sensory both grossly intact.   IMPRESSION: A 80 year old female who presents with respiratory failure, likely due to congestive heart failure, as well as an element of reactive airway disease. The patient denies chest pain, has ruled out for myocardial infarction with negative troponins x 3. The patient is in atrial flutter with a controlled rate, currently on Pradaxa for stroke prevention.   RECOMMENDATIONS:  1.  Agree with overall current therapy.  2.  Defer full-dose anticoagulation.  3.  Continue Pradaxa for stroke prevention.  4.  Continue diuresis.   Further recommendations pending the patient's initial clinical course.   ____________________________ Julie MillardAlexander Chasin Findling, MD ap:lt Tate: 11/09/2013 07:53:45 ET T: 11/09/2013 10:18:56 ET JOB#: [id number]  cc: Julie MillardAlexander Sonnie Pawloski, MD, <Dictator> Julie MillardALEXANDER Tyqwan Pink MD ELECTRONICALLY SIGNED 12/13/2013 13:56

## 2014-08-26 NOTE — H&P (Signed)
PATIENT NAME:  Julie Tate, CRANFIELD MR#:  [id number] DATE OF BIRTH:  08/20/1934  DATE OF ADMISSION:  11/08/2013  REFERRING PHYSICIAN: Dr. Fanny Bien  PRIMARY CARE PHYSICIAN:  Dr. Dossie Arbour  CARDIOLOGY:  Dr. Welton Flakes  CHIEF COMPLAINT: Shortness of breath.   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 Described 2-3 day duration of shortness of breath, mainly as dyspnea on exertion, with associated lower extremity edema and 3- pillow orthopnea. Has associated cough which is nonproductive. No fevers, chills, no chest pain, no further symptomatology. On arrival to the ER, noted to be hypoxemic, requiring BiPAP therapy.   REVIEW OF SYSTEMS:  CONSTITUTIONAL: Denies fever, fatigue, weakness.  EYES: Denies blurry vision, double vision, eye pain.  EARS, NOSE, THROAT: Denies tinnitus, ear pain, hearing loss.  RESPIRATORY: Positive for cough and shortness of breath as described above.  CARDIOVASCULAR:  Denies any chest pain, however positive for orthopnea and edema.  GASTROINTESTINAL: Denies nausea, vomiting, diarrhea, abdominal pain.  GENITOURINARY: No dysuria, hematuria.  ENDOCRINE: Denies nocturia or thyroid problems.  HEMATOLOGIC AND LYMPHATIC:  Denies easy bruising, bleeding.  SKIN: Denies rash or lesions.  MUSCULOSKELETAL: Denies pain in neck, back, shoulder, knees, hips or arthritic symptoms.  NEUROLOGIC: Denies paralysis, paresthesias.  PSYCHIATRIC: Denies anxiety or depressive symptoms.   Otherwise, full review of systems performed by me is negative.   PAST MEDICAL HISTORY: Atrial fibrillation, hypertension, asthma, combined diastolic and systolic congestive heart failure, EF of 45%.   SOCIAL HISTORY: No alcohol, tobacco, or drug usage.   FAMILY HISTORY: No history of cardiovascular or pulmonary disorders.    ALLERGIES: No known drug allergies.   HOME MEDICATIONS: Include diltiazem 180 mg p.o. daily, Pradaxa 150 mg p.o. b.i.d., Coreg 25 mg p.o. b.i.d., hydrochlorothiazide 25 mg p.o. daily.   PHYSICAL EXAMINATION:  VITAL SIGNS: Temperature 97.5, heart rate 82, respirations 24, blood pressure 162/64, saturating 93% on BiPAP therapy, weight 68 kg, BMI of 25.  GENERAL: Ill-appearing Caucasian female in minimal to moderate distress given respiratory status. Currently on BiPAP therapy.  HEAD: Normocephalic, atraumatic.  EYES: Pupils equal, round, intact. Extraocular movements intact. No scleral icterus.  MOUTH: Moist mucous membranes. Dentition intact. No abscess noted.  EARS, NOSE, AND THROAT:  Clear without exudates. No external lesions.  NECK: Supple. No thyromegaly. No nodules. No JVD.  PULMONARY: Clear to auscultation bilaterally without wheezes, rales, rhonchi.  Grossly diminished breath sounds throughout all lung fields with greatly diminished breath sounds at the bases with scattered coarse rhonchi. Good respiratory effort currently; however, on BiPAP therapy, tachypneic.  CHEST: Nontender to palpation.  CARDIOVASCULAR: S1, S2, irregular rate, irregular rhythm. No murmurs, rubs, or gallops and 1+ edema to the shins bilaterally. Pedal pulses 2+.  GASTROINTESTINAL: Soft, nontender, nondistended. No masses. Positive bowel sounds. No hepatosplenomegaly.  MUSCULOSKELETAL: No swelling, clubbing, edema.  Range of motion full in all extremities.   NEUROLOGICAL:  Cranial nerves II-XII intact.  No gross focal neurological deficits.  Sensation intact.  Reflexes intact.  SKIN: No ulceration, lesions, rash, cyanosis. Skin warm, dry. Turgor intact.  PSYCHIATRIC: Mood and affect within normal limits.  Patient awake, alert, oriented x3. Insight and judgment intact.   LABORATORY AND DIAGNOSTIC DATA:  EKG performed reveals atrial fibrillation, heart rate 98. No ST or T wave abnormalities. Sodium 126, potassium  3.6, chloride 92, bicarbonate 26, BUN 21, creatinine 1.08, glucose 240. Troponin I  less than 0.02. WBC 23, hemoglobin 12.5, platelets of 249,000. Chest x-ray performed revealing pulmonary edema bilateral pleural effusions.   ASSESSMENT AND PLAN: A 80 year old Caucasian female with history of atrial fibrillation as well as combined diastolic and systolic congestive heart failure, presenting with shortness of breath.  1. Acute respiratory failure with hypoxia indicated by oxygen saturation, inability to speak in full sentences as well as requirement of BiPAP. Continue BiPAP therapy. Wean as tolerated.  Continue oxygen therapy to maintain saturations greater than 92%. DuoNeb treatments as well as incentive spirometry when able.  2. Acute on chronic combined congestive heart failure. Diuresed with Lasix. Follow urine output, renal function ins and outs, placed on telemetry. Trend cardiac enzymes x 3. Per family request, they would like to see a cardiologist other than Dr. Welton FlakesKhan. [facility].  3. Hyponatremia in the setting of volume overload. Diurese with Lasix. Follow sodium levels.  4. Atrial fibrillation.  Continue Pradaxa for anticoagulation, Cardizem for rate control.  5. Venous thromboembolism prophylaxis with Pradaxa.  CODE STATUS:  Patient is full code.   CRITICAL CARE TIME SPENT: 45 minutes.    ____________________________ Cletis Athensavid K. Hower, MD dkh:dd D: 11/08/2013 20:57:00 ET T: 11/08/2013 21:14:08 ET JOB#: [id number]  cc: Cletis Athensavid K. Hower, MD, <Dictator> DAVID Synetta ShadowK HOWER MD ELECTRONICALLY SIGNED 11/09/2013 3:25

## 2014-08-26 NOTE — Discharge Summary (Signed)
PATIENT NAME:  Julie Tate, Julie Tate MR#:  [id number] DATE OF BIRTH:  [birth year]  DATE OF ADMISSION:  11/08/2013 DATE OF DISCHARGE:  11/15/2013  ADMITTING DIAGNOSIS: Shortness of breath.   DISCHARGE DIAGNOSES:  1.  Acute respiratory failure felt to be due to possible diastolic congestive heart failure, as well as possible underlying lung disease. 2.  Pleural effusion, status post thoracentesis with improvement in her symptoms on the right.  3.  Leukocytosis, possibly related to bronchitis, needs to have this followed up. Flow cytometry done on her chronic leukemia profile. Flow cytometry currently pending.  4.  Atrial fibrillation, which is chronic. The patient is on chronic anticoagulation therapy.   PERTINENT LABS AND EVALUATIONS: Echocardiogram showed an EF 55%-60%, moderately elevated pulmonary artery systolic pressures. Urinalysis was negative. WBC on admission 20.6, hemoglobin 12.2, platelet count 253,000, glucose 112, BUN 33, creatinine 1.11, sodium 130, potassium 2.8, chloride 986. CO2 was 33, calcium was 9.0. Sodium was 127.  On discharge, WBC is 16.7. CLL profile in the blood is pending. Pleural fluid cytology showed few atypical epithelial cells. Cultures showed no growth, a few white blood cells. No organism noted in the pleural fluid.  Color was pink. Pleural fluid nucleated cells 6212, neutrophils 30%.  LDH in the fluid was 107. Chest x-ray on admission showed cardiomegaly and pulmonary edema with associated small to moderate pleural effusions, greater on the right.  Basilar airspace disease, likely secondary to atelectasis from effusion.   HOSPITAL COURSE: Please refer to H and P done by the admitting physician. 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 The patient initially had to be placed on a  BiPAP on admission. She was diagnosed with acute congestive heart failure. She was treated with Lasix and she did put out good amount of urine; however, her symptoms continued to persist despite the Lasix therapy. Therefore, a chest x-ray was repeated, which continued to show pleural effusion due to her symptoms. She underwent a thoracentesis with significant improvement in her symptoms. The patient's pleural effusion is likely related to her CHF. She was also ambulated prior to discharge and noted to have oxygen drop in oxygen saturation. The patient was seen by cardiology during the hospitalization.  At this time, she is doing well and stable for discharge.   DISCHARGE MEDICATIONS: Diltiazem 180 daily, Pradaxa 150, 1 tab p.o. b.i.Tate., carvedilol 25 mg 1 tab p.o. b.i.Tate., Tylenol 650 q. 4 p.r.n., Spiriva 18 mcg daily, Lasix 40 daily, potassium 20 mEq 1 tab p.o. b.i.Tate., Ceftin 500 mg 1 tab p.o. b.i.Tate., Combivent 1 puff 4 times a day as needed.   HOME OXYGEN: Yes, 2 liters via nasal cannula.   DIET: Low sodium, low fat, low cholesterol.   ACTIVITY: As tolerated.   FOLLOWUP: Follow with primary M.Tate. in 1- 2 weeks. Follow up with Dr. Welton FlakesKhan of cardiology in 1-2 weeks. The patient also needs to see Dr. Meredeth IdeFleming in 2-3 weeks. Heart failure clinic on 12/20/2013.   TIME SPENT: 35 minutes on this patient.    ____________________________ Lacie ScottsShreyang H. Allena KatzPatel, MD shp:ts Tate: 11/16/2013 08:14:54 ET T: 11/16/2013 11:02:01 ET JOB#: [id number]  cc: Kiffany Schelling H. Allena KatzPatel, MD, <Dictator> Charise CarwinSHREYANG H Dynastie Knoop MD ELECTRONICALLY SIGNED 11/20/2013 9:46

## 2014-11-15 DIAGNOSIS — R809 Proteinuria, unspecified: Secondary | ICD-10-CM | POA: Insufficient documentation

## 2014-11-15 DIAGNOSIS — N183 Chronic kidney disease, stage 3 unspecified: Secondary | ICD-10-CM | POA: Insufficient documentation

## 2014-11-15 DIAGNOSIS — I10 Essential (primary) hypertension: Secondary | ICD-10-CM

## 2014-11-15 DIAGNOSIS — M8580 Other specified disorders of bone density and structure, unspecified site: Secondary | ICD-10-CM | POA: Insufficient documentation

## 2014-11-15 DIAGNOSIS — I509 Heart failure, unspecified: Secondary | ICD-10-CM | POA: Insufficient documentation

## 2014-11-15 DIAGNOSIS — I4891 Unspecified atrial fibrillation: Secondary | ICD-10-CM

## 2014-11-15 DIAGNOSIS — J45909 Unspecified asthma, uncomplicated: Secondary | ICD-10-CM | POA: Insufficient documentation

## 2014-11-15 DIAGNOSIS — I1310 Hypertensive heart and chronic kidney disease without heart failure, with stage 1 through stage 4 chronic kidney disease, or unspecified chronic kidney disease: Secondary | ICD-10-CM | POA: Insufficient documentation

## 2014-11-15 DIAGNOSIS — I129 Hypertensive chronic kidney disease with stage 1 through stage 4 chronic kidney disease, or unspecified chronic kidney disease: Secondary | ICD-10-CM | POA: Insufficient documentation

## 2014-11-15 DIAGNOSIS — I130 Hypertensive heart and chronic kidney disease with heart failure and stage 1 through stage 4 chronic kidney disease, or unspecified chronic kidney disease: Secondary | ICD-10-CM

## 2014-11-16 ENCOUNTER — Encounter: Payer: Self-pay | Admitting: Family Medicine

## 2014-11-16 ENCOUNTER — Ambulatory Visit (INDEPENDENT_AMBULATORY_CARE_PROVIDER_SITE_OTHER): Payer: Medicare Other | Admitting: Family Medicine

## 2014-11-16 VITALS — BP 139/84 | HR 102 | Temp 97.8°F | Ht 64.5 in | Wt 158.2 lb

## 2014-11-16 DIAGNOSIS — I480 Paroxysmal atrial fibrillation: Secondary | ICD-10-CM | POA: Diagnosis not present

## 2014-11-16 DIAGNOSIS — I130 Hypertensive heart and chronic kidney disease with heart failure and stage 1 through stage 4 chronic kidney disease, or unspecified chronic kidney disease: Secondary | ICD-10-CM

## 2014-11-16 DIAGNOSIS — I5020 Unspecified systolic (congestive) heart failure: Secondary | ICD-10-CM

## 2014-11-16 DIAGNOSIS — J018 Other acute sinusitis: Secondary | ICD-10-CM | POA: Diagnosis not present

## 2014-11-16 MED ORDER — TIOTROPIUM BROMIDE MONOHYDRATE 18 MCG IN CAPS: 18.0000 ug | ORAL_CAPSULE | Freq: Every day | RESPIRATORY_TRACT | Status: DC

## 2014-11-16 MED ORDER — CARVEDILOL 25 MG PO TABS: 25.0000 mg | ORAL_TABLET | Freq: Two times a day (BID) | ORAL | Status: DC

## 2014-11-16 MED ORDER — RIVAROXABAN 15 MG PO TABS: 15.0000 mg | ORAL_TABLET | Freq: Every day | ORAL | Status: DC

## 2014-11-16 MED ORDER — AMOXICILLIN 875 MG PO TABS
875.0000 mg | ORAL_TABLET | Freq: Two times a day (BID) | ORAL | Status: DC
Start: 2014-11-16 — End: 2015-05-31

## 2014-11-16 MED ORDER — DILTIAZEM HCL ER COATED BEADS 240 MG PO CP24: 240.0000 mg | ORAL_CAPSULE | Freq: Every day | ORAL | Status: DC

## 2014-11-16 MED ORDER — POTASSIUM CHLORIDE ER 8 MEQ PO TBCR: 8.0000 meq | EXTENDED_RELEASE_TABLET | Freq: Every day | ORAL | Status: DC

## 2014-11-16 MED ORDER — FUROSEMIDE 20 MG PO TABS: 20.0000 mg | ORAL_TABLET | Freq: Two times a day (BID) | ORAL | Status: DC

## 2014-11-16 NOTE — Progress Notes (Signed)
BP 139/84 mmHg  Pulse 102  Temp(Src) 97.8 F (36.6 C)  Ht 5' 4.5" (1.638 m)  Wt 158 lb 3.2 oz (71.759 kg)  BMI 26.75 kg/m2  SpO2 89%  LMP  (LMP Unknown)   Subjective:    Patient ID: Julie Tate, female    DOB: Apr 04, 1935, 80 y.o.   MRN: [id number]  HPI: Julie Tate is a 80 y.o. female  Chief Complaint  Patient presents with  . Hypertension  . Cough  . Sinusitis  Doing well with all medicine. Takes everyday with no side effects. Stable from last visit. Medical problems reviewed and stable  For sinusitis 2 weeks feeling bad with clasic sinus sx  No bleeding noticed doing well with meds No bruising No pnd still mild edema No SOB other than sinusitis sx      Relevant past medical, surgical, family and social history reviewed and updated as indicated. Interim medical history since our last visit reviewed. Allergies and medications reviewed and updated.  Review of Systems  Constitutional: Positive for fatigue. Negative for fever.  HENT: Positive for congestion, ear pain, postnasal drip, rhinorrhea, sinus pressure, sneezing and sore throat.   Respiratory: Positive for cough. Negative for shortness of breath and wheezing.   Cardiovascular: Negative for chest pain and palpitations.    Per HPI unless specifically indicated above     Objective:    BP 139/84 mmHg  Pulse 102  Temp(Src) 97.8 F (36.6 C)  Ht 5' 4.5" (1.638 m)  Wt 158 lb 3.2 oz (71.759 kg)  BMI 26.75 kg/m2  SpO2 89%  LMP  (LMP Unknown)  Wt Readings from Last 3 Encounters:  11/16/14 158 lb 3.2 oz (71.759 kg)  11/15/14 156 lb (70.761 kg)    Physical Exam  Constitutional: She is oriented to person, place, and time. She appears well-developed and well-nourished. No distress.  HENT:  Head: Normocephalic and atraumatic.  Right Ear: Hearing and external ear normal.  Left Ear: Hearing and external ear normal.  Nose: Nose normal.  Mouth/Throat: Oropharyngeal exudate present.  Eyes:  Conjunctivae and lids are normal. Right eye exhibits no discharge. Left eye exhibits no discharge. No scleral icterus.  Cardiovascular: Normal heart sounds.   Pulmonary/Chest: Effort normal. No respiratory distress. She has no wheezes. She has no rales.  Musculoskeletal: Normal range of motion.  Neurological: She is alert and oriented to person, place, and time.  Skin: Skin is intact. No rash noted.  Psychiatric: She has a normal mood and affect. Her speech is normal and behavior is normal. Judgment and thought content normal. Cognition and memory are normal.    Results for orders placed or performed in visit on 11/08/13  Culture, blood (single)  Result Value Ref Range   Micro Text Report         COMMENT                   NO GROWTH AEROBICALLY/ANAEROBICALLY IN 5 DAYS   ANTIBIOTIC                                                      Culture, blood (single)  Result Value Ref Range   Micro Text Report         COMMENT  NO GROWTH AEROBICALLY/ANAEROBICALLY IN 5 DAYS   ANTIBIOTIC                                                      Body fluid culture  Result Value Ref Range   Micro Text Report         SOURCE: right pleural    COMMENT                   NO GROWTH AEROBICALLY/ANAEROBICALLY IN 4 DAYS   GRAM STAIN                FEW WHITE BLOOD CELLS   GRAM STAIN                NO ORGANISMS SEEN   ANTIBIOTIC                                                      Troponin I  Result Value Ref Range   Troponin-I < 0.02 ng/mL  CK total and CKMB (cardiac)  Result Value Ref Range   CK, Total 70 Unit/L   CK-MB < 0.5 (L) 0.5-3.6 ng/mL  Basic metabolic panel  Result Value Ref Range   Glucose 240 (H) 65-99 mg/dL   BUN 21 (H) 7-18 mg/dL   Creatinine 1.08 0.60-1.30 mg/dL   Sodium 126 (L) 136-145 mmol/L   Potassium 3.6 3.5-5.1 mmol/L   Chloride 92 (L) 98-107 mmol/L   Co2 26 21-32 mmol/L   Calcium, Total 9.0 8.5-10.1 mg/dL   Osmolality 264 275-301   Anion Gap 8 7-16    EGFR (African American) 57 (L)    EGFR (Non-African Amer.) 49 (L)   CBC  Result Value Ref Range   WBC 23.0 (H) 3.6-11.0 x10 3/mm 3   RBC 4.45 3.80-5.20 X10 6/mm 3   HGB 12.5 12.0-16.0 g/dL   HCT 38.9 35.0-47.0 %   MCV 88 80-100 fL   MCH 28.0 26.0-34.0 pg   MCHC 32.0 32.0-36.0 g/dL   RDW 14.4 11.5-14.5 %   Platelet 249 150-440 x10 3/mm 3  Pro b natriuretic peptide (BNP)  Result Value Ref Range   B-Type Natiuretic Peptid 2982 (H) 0-450 pg/mL  Troponin I  Result Value Ref Range   Troponin-I < 0.02 ng/mL  Troponin I  Result Value Ref Range   Troponin-I < 0.02 ng/mL  Urinalysis, Complete  Result Value Ref Range   Color - urine Yellow    Clarity - urine Clear    Glucose,UR Negative 0-75 mg/dL   Bilirubin,UR Negative NEGATIVE   Ketone Negative NEGATIVE   Specific Gravity 1.011 1.003-1.030   Blood 1+ NEGATIVE   Ph 5.0 4.5-8.0   Protein Negative NEGATIVE   Nitrite Negative NEGATIVE   Leukocyte Esterase Negative NEGATIVE   RBC,UR 2 /HPF 0-5 /HPF   WBC UR 1 /HPF 0-5 /HPF   Bacteria TRACE NONE SEEN   Squamous Epithelial 1 /HPF   CBC with Differential/Platelet  Result Value Ref Range   WBC 20.6 (H) 3.6-11.0 x10 3/mm 3   RBC 4.07 3.80-5.20 X10 6/mm 3   HGB 12.2 12.0-16.0 g/dL   HCT 36.5 35.0-47.0 %   MCV 90 80-100  fL   MCH 30.1 26.0-34.0 pg   MCHC 33.5 32.0-36.0 g/dL   RDW 14.3 11.5-14.5 %   Platelet 253 150-440 x10 3/mm 3   Neutrophil % 79.5 %   Lymphocyte % 7.8 %   Monocyte % 11.9 %   Eosinophil % 0.5 %   Basophil % 0.3 %   Neutrophil # 16.4 (H) 1.4-6.5 x10 3/mm 3   Lymphocyte # 1.6 1.0-3.6 x10 3/mm 3   Monocyte # 2.5 (H) 0.2-0.9 x10 3/mm    Eosinophil # 0.1 0.0-0.7 x10 3/mm 3   Basophil # 0.1 0.0-0.1 x10 3/mm 3  Basic metabolic panel  Result Value Ref Range   Glucose 112 (H) 65-99 mg/dL   BUN 36 (H) 7-18 mg/dL   Creatinine 1.11 0.60-1.30 mg/dL   Sodium 130 (L) 136-145 mmol/L   Potassium 2.8 (L) 3.5-5.1 mmol/L   Chloride 90 (L) 98-107 mmol/L   Co2 33 (H) 21-32  mmol/L   Calcium, Total 9.0 8.5-10.1 mg/dL   Osmolality 270 275-301   Anion Gap 7 7-16   EGFR (African American) 55 (L)    EGFR (Non-African Amer.) 48 (L)   Magnesium  Result Value Ref Range   Magnesium 1.8 mg/dL  Basic metabolic panel  Result Value Ref Range   Glucose 125 (H) 65-99 mg/dL   BUN 34 (H) 7-18 mg/dL   Creatinine 0.97 0.60-1.30 mg/dL   Sodium 131 (L) 136-145 mmol/L   Potassium 3.8 3.5-5.1 mmol/L   Chloride 94 (L) 98-107 mmol/L   Co2 31 21-32 mmol/L   Calcium, Total 8.7 8.5-10.1 mg/dL   Osmolality 272 275-301   Anion Gap 6 (L) 7-16   EGFR (African American) >60    EGFR (Non-African Amer.) 56 (L)   Magnesium  Result Value Ref Range   Magnesium 2.3 mg/dL  CBC with Differential/Platelet  Result Value Ref Range   WBC 17.5 (H) 3.6-11.0 x10 3/mm 3   RBC 4.08 3.80-5.20 X10 6/mm 3   HGB 11.9 (L) 12.0-16.0 g/dL   HCT 36.3 35.0-47.0 %   MCV 89 80-100 fL   MCH 29.1 26.0-34.0 pg   MCHC 32.7 32.0-36.0 g/dL   RDW 14.1 11.5-14.5 %   Platelet 286 150-440 x10 3/mm 3   Neutrophil % 78.5 %   Lymphocyte % 8.5 %   Monocyte % 12.1 %   Eosinophil % 0.8 %   Basophil % 0.1 %   Neutrophil # 13.7 (H) 1.4-6.5 x10 3/mm 3   Lymphocyte # 1.5 1.0-3.6 x10 3/mm 3   Monocyte # 2.1 (H) 0.2-0.9 x10 3/mm    Eosinophil # 0.1 0.0-0.7 x10 3/mm 3   Basophil # 0.0 0.0-0.1 x10 3/mm 3  Basic metabolic panel  Result Value Ref Range   Glucose 114 (H) 65-99 mg/dL   BUN 33 (H) 7-18 mg/dL   Creatinine 0.85 0.60-1.30 mg/dL   Sodium 129 (L) 136-145 mmol/L   Potassium 3.9 3.5-5.1 mmol/L   Chloride 90 (L) 98-107 mmol/L   Co2 32 21-32 mmol/L   Calcium, Total 8.8 8.5-10.1 mg/dL   Osmolality 267 275-301   Anion Gap 7 7-16   EGFR (African American) >60    EGFR (Non-African Amer.) >60   CBC with Differential/Platelet  Result Value Ref Range   WBC 16.4 (H) 3.6-11.0 x10 3/mm 3   RBC 4.13 3.80-5.20 X10 6/mm 3   HGB 12.1 12.0-16.0 g/dL   HCT 36.1 35.0-47.0 %   MCV 87 80-100 fL   MCH 29.4 26.0-34.0 pg    MCHC 33.6 32.0-36.0  g/dL   RDW 14.2 11.5-14.5 %   Platelet 283 150-440 x10 3/mm 3   Neutrophil % 79.2 %   Lymphocyte % 8.5 %   Monocyte % 11.2 %   Eosinophil % 0.9 %   Basophil % 0.2 %   Neutrophil # 13.0 (H) 1.4-6.5 x10 3/mm 3   Lymphocyte # 1.4 1.0-3.6 x10 3/mm 3   Monocyte # 1.8 (H) 0.2-0.9 x10 3/mm    Eosinophil # 0.1 0.0-0.7 x10 3/mm 3   Basophil # 0.0 0.0-0.1 x10 3/mm 3  CBC with Differential/Platelet  Result Value Ref Range   WBC 17.5 (H) 3.6-11.0 x10 3/mm 3   RBC 4.35 3.80-5.20 X10 6/mm 3   HGB 12.3 12.0-16.0 g/dL   HCT 38.4 35.0-47.0 %   MCV 88 80-100 fL   MCH 28.3 26.0-34.0 pg   MCHC 32.1 32.0-36.0 g/dL   RDW 14.2 11.5-14.5 %   Platelet 347 150-440 x10 3/mm 3   Neutrophil % 79.7 %   Lymphocyte % 7.9 %   Monocyte % 11.4 %   Eosinophil % 0.7 %   Basophil % 0.3 %   Neutrophil # 13.9 (H) 1.4-6.5 x10 3/mm 3   Lymphocyte # 1.4 1.0-3.6 x10 3/mm 3   Monocyte # 2.0 (H) 0.2-0.9 x10 3/mm    Eosinophil # 0.1 0.0-0.7 x10 3/mm 3   Basophil # 0.1 0.0-0.1 x10 3/mm 3  Basic metabolic panel  Result Value Ref Range   Glucose 124 (H) 65-99 mg/dL   BUN 25 (H) 7-18 mg/dL   Creatinine 0.96 0.60-1.30 mg/dL   Sodium 127 (L) 136-145 mmol/L   Potassium 4.1 3.5-5.1 mmol/L   Chloride 90 (L) 98-107 mmol/L   Co2 33 (H) 21-32 mmol/L   Calcium, Total 9.0 8.5-10.1 mg/dL   Osmolality 261 275-301   Anion Gap 4 (L) 7-16   EGFR (African American) >60    EGFR (Non-African Amer.) 57 (L)   Basic metabolic panel  Result Value Ref Range   Glucose 109 (H) 65-99 mg/dL   BUN 23 (H) 7-18 mg/dL   Creatinine 0.93 0.60-1.30 mg/dL   Sodium 128 (L) 136-145 mmol/L   Potassium 3.9 3.5-5.1 mmol/L   Chloride 89 (L) 98-107 mmol/L   Co2 32 21-32 mmol/L   Calcium, Total 8.7 8.5-10.1 mg/dL   Osmolality 261 275-301   Anion Gap 7 7-16   EGFR (African American) >60    EGFR (Non-African Amer.) 59 (L)   Magnesium  Result Value Ref Range   Magnesium 2.3 mg/dL  CBC with Differential/Platelet  Result  Value Ref Range   WBC 17.7 (H) 3.6-11.0 x10 3/mm 3   RBC 4.14 3.80-5.20 X10 6/mm 3   HGB 12.0 12.0-16.0 g/dL   HCT 35.9 35.0-47.0 %   MCV 87 80-100 fL   MCH 29.0 26.0-34.0 pg   MCHC 33.4 32.0-36.0 g/dL   RDW 14.2 11.5-14.5 %   Platelet 328 150-440 x10 3/mm 3   Neutrophil % 77.0 %   Lymphocyte % 7.0 %   Monocyte % 14.2 %   Eosinophil % 1.5 %   Basophil % 0.3 %   Neutrophil # 13.6 (H) 1.4-6.5 x10 3/mm 3   Lymphocyte # 1.2 1.0-3.6 x10 3/mm 3   Monocyte # 2.5 (H) 0.2-0.9 x10 3/mm    Eosinophil # 0.3 0.0-0.7 x10 3/mm 3   Basophil # 0.1 0.0-0.1 x10 3/mm 3  Body fluid cell count with differential  Result Value Ref Range   Body Fluid Source PLEURAL EFFUSION    Color  PINK    Clarity CLOUDY    Nucleated Cell Count 6212 /mm 3   Neutrophils 30 %   Lymphocytes 63 %   Other Mononuclear Cells 7 %   Eosinophil 0 %   Basophil 0 %   Other Cells BF 0 %  Lactate dehydrogenase, pleural or peritoneal fluid  Result Value Ref Range   LDH, Body Fluid 107 Unit/L   Source, Body Fluid PLEURAL EFFUSION   Basic metabolic panel  Result Value Ref Range   Glucose 207 (H) 65-99 mg/dL   BUN 22 (H) 7-18 mg/dL   Creatinine 1.06 0.60-1.30 mg/dL   Sodium 127 (L) 136-145 mmol/L   Potassium 3.9 3.5-5.1 mmol/L   Chloride 88 (L) 98-107 mmol/L   Co2 33 (H) 21-32 mmol/L   Calcium, Total 8.8 8.5-10.1 mg/dL   Osmolality 265 275-301   Anion Gap 6 (L) 7-16   EGFR (African American) 59 (L)    EGFR (Non-African Amer.) 51 (L)   CBC with Differential/Platelet  Result Value Ref Range   WBC 16.7 (H) 3.6-11.0 x10 3/mm 3   RBC 4.36 3.80-5.20 X10 6/mm 3   HGB 12.0 12.0-16.0 g/dL   HCT 38.4 35.0-47.0 %   MCV 88 80-100 fL   MCH 27.6 26.0-34.0 pg   MCHC 31.4 (L) 32.0-36.0 g/dL   RDW 14.3 11.5-14.5 %   Platelet 359 150-440 x10 3/mm 3   Neutrophil % 80.1 %   Lymphocyte % 7.0 %   Monocyte % 10.7 %   Eosinophil % 2.0 %   Basophil % 0.2 %   Neutrophil # 13.3 (H) 1.4-6.5 x10 3/mm 3   Lymphocyte # 1.2 1.0-3.6 x10  3/mm 3   Monocyte # 1.8 (H) 0.2-0.9 x10 3/mm    Eosinophil # 0.3 0.0-0.7 x10 3/mm 3   Basophil # 0.0 0.0-0.1 x10 3/mm 3      Assessment & Plan:   Problem List Items Addressed This Visit      Cardiovascular and Mediastinum   Atrial fibrillation - Primary    The current medical regimen is effective;  continue present plan and medications.       Relevant Medications   Rivaroxaban (XARELTO) 15 MG TABS tablet   furosemide (LASIX) 20 MG tablet   diltiazem (CARDIZEM CD) 240 MG 24 hr capsule   carvedilol (COREG) 25 MG tablet   Other Relevant Orders   CBC with Differential/Platelet   Basic metabolic panel   CHF (congestive heart failure)    The current medical regimen is effective;  continue present plan and medications.       Relevant Medications   Rivaroxaban (XARELTO) 15 MG TABS tablet   potassium chloride (KLOR-CON) 8 MEQ tablet   furosemide (LASIX) 20 MG tablet   diltiazem (CARDIZEM CD) 240 MG 24 hr capsule   carvedilol (COREG) 25 MG tablet   Hypertensive heart and renal disease    The current medical regimen is effective;  continue present plan and medications.       Relevant Medications   Rivaroxaban (XARELTO) 15 MG TABS tablet   potassium chloride (KLOR-CON) 8 MEQ tablet   furosemide (LASIX) 20 MG tablet   diltiazem (CARDIZEM CD) 240 MG 24 hr capsule   carvedilol (COREG) 25 MG tablet   Other Relevant Orders   CBC with Differential/Platelet   Basic metabolic panel    Other Visit Diagnoses    Other acute sinusitis        Relevant Medications    amoxicillin (AMOXIL) 875 MG tablet  Follow up plan: Return in about 6 months (around 05/19/2015), or if symptoms worsen or fail to improve, for PE, Physical Exam.

## 2014-11-16 NOTE — Assessment & Plan Note (Signed)
The current medical regimen is effective;  continue present plan and medications.  

## 2014-11-17 LAB — CBC WITH DIFFERENTIAL/PLATELET
Basophils Absolute: 0 10*3/uL (ref 0.0–0.2)
Basos: 0 %
EOS (ABSOLUTE): 0.1 10*3/uL (ref 0.0–0.4)
EOS: 1 %
Hematocrit: 39.9 % (ref 34.0–46.6)
Hemoglobin: 13.1 g/dL (ref 11.1–15.9)
IMMATURE GRANS (ABS): 0 10*3/uL (ref 0.0–0.1)
Immature Granulocytes: 0 %
LYMPHS ABS: 1 10*3/uL (ref 0.7–3.1)
Lymphs: 10 %
MCH: 27.7 pg (ref 26.6–33.0)
MCHC: 32.8 g/dL (ref 31.5–35.7)
MCV: 84 fL (ref 79–97)
MONOCYTES: 10 %
Monocytes Absolute: 0.9 10*3/uL (ref 0.1–0.9)
Neutrophils Absolute: 7.6 10*3/uL — ABNORMAL HIGH (ref 1.4–7.0)
Neutrophils: 79 %
PLATELETS: 297 10*3/uL (ref 150–379)
RBC: 4.73 x10E6/uL (ref 3.77–5.28)
RDW: 15.5 % — ABNORMAL HIGH (ref 12.3–15.4)
WBC: 9.7 10*3/uL (ref 3.4–10.8)

## 2014-11-17 LAB — BASIC METABOLIC PANEL
BUN/Creatinine Ratio: 17 (ref 11–26)
BUN: 15 mg/dL (ref 8–27)
CALCIUM: 9.7 mg/dL (ref 8.7–10.3)
CO2: 26 mmol/L (ref 18–29)
Chloride: 94 mmol/L — ABNORMAL LOW (ref 97–108)
Creatinine, Ser: 0.86 mg/dL (ref 0.57–1.00)
GFR calc non Af Amer: 65 mL/min/{1.73_m2} (ref >59)
GFR, EST AFRICAN AMERICAN: 75 mL/min/{1.73_m2} (ref >59)
Glucose: 121 mg/dL — ABNORMAL HIGH (ref 65–99)
Potassium: 4.1 mmol/L (ref 3.5–5.2)
Sodium: 138 mmol/L (ref 134–144)

## 2015-02-20 ENCOUNTER — Telehealth: Payer: Self-pay

## 2015-02-20 NOTE — Telephone Encounter (Signed)
Called patient to ask about colorectal screening and eye exam. Patient told me that she didn't want another eye exam, and the doctor told her she didn't have to come back because she had both cataracts removed. Patient said she didn't want any colorectal screening including the cologuard and colonoscopy.

## 2015-02-26 ENCOUNTER — Other Ambulatory Visit: Payer: Self-pay | Admitting: Family Medicine

## 2015-05-23 ENCOUNTER — Encounter: Payer: Medicare Other | Admitting: Family Medicine

## 2015-05-31 ENCOUNTER — Encounter: Payer: Self-pay | Admitting: Family Medicine

## 2015-05-31 ENCOUNTER — Ambulatory Visit (INDEPENDENT_AMBULATORY_CARE_PROVIDER_SITE_OTHER): Payer: Medicare Other | Admitting: Family Medicine

## 2015-05-31 VITALS — BP 158/79 | HR 73 | Temp 98.0°F | Ht 64.2 in | Wt 158.0 lb

## 2015-05-31 DIAGNOSIS — I130 Hypertensive heart and chronic kidney disease with heart failure and stage 1 through stage 4 chronic kidney disease, or unspecified chronic kidney disease: Secondary | ICD-10-CM | POA: Diagnosis not present

## 2015-05-31 DIAGNOSIS — I509 Heart failure, unspecified: Secondary | ICD-10-CM | POA: Diagnosis not present

## 2015-05-31 DIAGNOSIS — Z0000 Encounter for general adult medical examination without abnormal findings: Secondary | ICD-10-CM

## 2015-05-31 DIAGNOSIS — I5020 Unspecified systolic (congestive) heart failure: Secondary | ICD-10-CM

## 2015-05-31 DIAGNOSIS — J018 Other acute sinusitis: Secondary | ICD-10-CM

## 2015-05-31 DIAGNOSIS — Z23 Encounter for immunization: Secondary | ICD-10-CM

## 2015-05-31 DIAGNOSIS — R600 Localized edema: Secondary | ICD-10-CM | POA: Insufficient documentation

## 2015-05-31 DIAGNOSIS — I480 Paroxysmal atrial fibrillation: Secondary | ICD-10-CM

## 2015-05-31 DIAGNOSIS — J019 Acute sinusitis, unspecified: Secondary | ICD-10-CM | POA: Insufficient documentation

## 2015-05-31 LAB — URINALYSIS, ROUTINE W REFLEX MICROSCOPIC
BILIRUBIN UA: NEGATIVE
GLUCOSE, UA: NEGATIVE
Ketones, UA: NEGATIVE
Leukocytes, UA: NEGATIVE
NITRITE UA: NEGATIVE
Specific Gravity, UA: 1.025 (ref 1.005–1.030)
UUROB: 4 mg/dL — AB (ref 0.2–1.0)
pH, UA: 8.5 — ABNORMAL HIGH (ref 5.0–7.5)

## 2015-05-31 LAB — MICROSCOPIC EXAMINATION

## 2015-05-31 MED ORDER — TIOTROPIUM BROMIDE MONOHYDRATE 18 MCG IN CAPS: 18.0000 ug | ORAL_CAPSULE | Freq: Every day | RESPIRATORY_TRACT | Status: DC

## 2015-05-31 MED ORDER — DILTIAZEM HCL ER COATED BEADS 240 MG PO CP24: 240.0000 mg | ORAL_CAPSULE | Freq: Every day | ORAL | Status: DC

## 2015-05-31 MED ORDER — AMOXICILLIN 875 MG PO TABS: 875.0000 mg | ORAL_TABLET | Freq: Two times a day (BID) | ORAL | Status: DC

## 2015-05-31 MED ORDER — RIVAROXABAN 15 MG PO TABS: 15.0000 mg | ORAL_TABLET | Freq: Every day | ORAL | Status: DC

## 2015-05-31 MED ORDER — FUROSEMIDE 20 MG PO TABS: 20.0000 mg | ORAL_TABLET | Freq: Two times a day (BID) | ORAL | Status: DC

## 2015-05-31 MED ORDER — POTASSIUM CHLORIDE ER 8 MEQ PO TBCR: 16.0000 meq | EXTENDED_RELEASE_TABLET | Freq: Every day | ORAL | Status: DC

## 2015-05-31 MED ORDER — CARVEDILOL 25 MG PO TABS: 25.0000 mg | ORAL_TABLET | Freq: Two times a day (BID) | ORAL | Status: DC

## 2015-05-31 NOTE — Assessment & Plan Note (Signed)
Discussed sinusitis care and treatment use of medications

## 2015-05-31 NOTE — Assessment & Plan Note (Signed)
Left leg edema on Xarelto going on about 4 months We'll start workup with ultrasound of leg will consider abdominal ultrasound CT etc. if necessary

## 2015-05-31 NOTE — Progress Notes (Signed)
BP 158/79 mmHg  Pulse 73  Temp(Src) 98 F (36.7 C)  Ht 5' 4.2" (1.631 m)  Wt 158 lb (71.668 kg)  BMI 26.94 kg/m2  LMP  (LMP Unknown)   Subjective:    Patient ID: Julie Tate, female    DOB: August 24, 1934, 81 y.o.   MRN: [id number]  HPI: Julie Tate is a 81 y.o. female  Chief Complaint  Patient presents with  . Annual Exam  . URI  . Alopecia   Patient with head cold sinus congestion drainage and low-grade fever ongoing for over a week has been using a lot of OTC medications and getting worse. Patient also with left leg edema up to lower thigh goes away at night comes back during the day no edema of her right leg no PND orthopnea Patient takes Xarelto without problems no bleeding bruising issues Breathing does well with Spiriva  Relevant past medical, surgical, family and social history reviewed and updated as indicated. Interim medical history since our last visit reviewed. Allergies and medications reviewed and updated.  Review of Systems  Constitutional: Negative.   HENT: Negative.   Eyes: Negative.   Respiratory: Negative.   Cardiovascular: Negative.   Gastrointestinal: Negative.   Endocrine: Negative.   Genitourinary: Negative.   Musculoskeletal: Negative.   Skin: Negative.   Allergic/Immunologic: Negative.   Neurological: Negative.   Hematological: Negative.   Psychiatric/Behavioral: Negative.     Per HPI unless specifically indicated above     Objective:    BP 158/79 mmHg  Pulse 73  Temp(Src) 98 F (36.7 C)  Ht 5' 4.2" (1.631 m)  Wt 158 lb (71.668 kg)  BMI 26.94 kg/m2  LMP  (LMP Unknown)  Wt Readings from Last 3 Encounters:  05/31/15 158 lb (71.668 kg)  11/16/14 158 lb 3.2 oz (71.759 kg)  11/15/14 156 lb (70.761 kg)    Physical Exam  Constitutional: She is oriented to person, place, and time. She appears well-developed and well-nourished.  HENT:  Head: Normocephalic and atraumatic.  Right Ear: External ear normal.  Left Ear: External  ear normal.  Nose: Nose normal.  Mouth/Throat: Oropharynx is clear and moist.  Changes sinusitis  Eyes: Conjunctivae and EOM are normal. Pupils are equal, round, and reactive to light.  Neck: Normal range of motion. Neck supple. Carotid bruit is not present.  Cardiovascular: Normal heart sounds.   No murmur heard. Pulmonary/Chest: Effort normal and breath sounds normal. She exhibits no mass. Right breast exhibits no mass, no skin change and no tenderness. Left breast exhibits no mass, no skin change and no tenderness. Breasts are symmetrical.  Abdominal: Soft. Bowel sounds are normal. There is no hepatosplenomegaly.  Musculoskeletal: Normal range of motion.  Left leg 2+ edema to knee  Neurological: She is alert and oriented to person, place, and time.  Skin: No rash noted.  Psychiatric: She has a normal mood and affect. Her behavior is normal. Judgment and thought content normal.    Results for orders placed or performed in visit on 11/16/14  CBC with Differential/Platelet  Result Value Ref Range   WBC 9.7 3.4 - 10.8 x10E3/uL   RBC 4.73 3.77 - 5.28 x10E6/uL   Hemoglobin 13.1 11.1 - 15.9 g/dL   Hematocrit 95.6 21.3 - 46.6 %   MCV 84 79 - 97 fL   MCH 27.7 26.6 - 33.0 pg   MCHC 32.8 31.5 - 35.7 g/dL   RDW 08.6 (H) 57.8 - 46.9 %   Platelets 297 150 - 379  x10E3/uL   Neutrophils 79 %   Lymphs 10 %   Monocytes 10 %   Eos 1 %   Basos 0 %   Neutrophils Absolute 7.6 (H) 1.4 - 7.0 x10E3/uL   Lymphocytes Absolute 1.0 0.7 - 3.1 x10E3/uL   Monocytes Absolute 0.9 0.1 - 0.9 x10E3/uL   EOS (ABSOLUTE) 0.1 0.0 - 0.4 x10E3/uL   Basophils Absolute 0.0 0.0 - 0.2 x10E3/uL   Immature Granulocytes 0 %   Immature Grans (Abs) 0.0 0.0 - 0.1 x10E3/uL  Basic metabolic panel  Result Value Ref Range   Glucose 121 (H) 65 - 99 mg/dL   BUN 15 8 - 27 mg/dL   Creatinine, Ser 1.61 0.57 - 1.00 mg/dL   GFR calc non Af Amer 65 >59 mL/min/1.73   GFR calc Af Amer 75 >59 mL/min/1.73   BUN/Creatinine Ratio 17 11  - 26   Sodium 138 134 - 144 mmol/L   Potassium 4.1 3.5 - 5.2 mmol/L   Chloride 94 (L) 97 - 108 mmol/L   CO2 26 18 - 29 mmol/L   Calcium 9.7 8.7 - 10.3 mg/dL      Assessment & Plan:   Problem List Items Addressed This Visit      Cardiovascular and Mediastinum   Hypertensive heart and renal disease   Relevant Medications   carvedilol (COREG) 25 MG tablet   diltiazem (CARDIZEM CD) 240 MG 24 hr capsule   furosemide (LASIX) 20 MG tablet   Rivaroxaban (XARELTO) 15 MG TABS tablet   Other Relevant Orders   Lipid panel   CBC with Differential/Platelet   Urinalysis, Routine w reflex microscopic (not at [facility])   TSH   CHF (congestive heart failure) (HCC)   Relevant Medications   carvedilol (COREG) 25 MG tablet   diltiazem (CARDIZEM CD) 240 MG 24 hr capsule   furosemide (LASIX) 20 MG tablet   Rivaroxaban (XARELTO) 15 MG TABS tablet   Other Relevant Orders   Comprehensive metabolic panel   Lipid panel   CBC with Differential/Platelet   Urinalysis, Routine w reflex microscopic (not at [facility])   TSH   Atrial fibrillation (HCC)   Relevant Medications   carvedilol (COREG) 25 MG tablet   diltiazem (CARDIZEM CD) 240 MG 24 hr capsule   furosemide (LASIX) 20 MG tablet   Rivaroxaban (XARELTO) 15 MG TABS tablet   Other Relevant Orders   Lipid panel   CBC with Differential/Platelet   Urinalysis, Routine w reflex microscopic (not at [facility])   TSH     Respiratory   Sinusitis, acute    Discussed sinusitis care and treatment use of medications      Relevant Medications   amoxicillin (AMOXIL) 875 MG tablet     Other   Leg edema, left    Left leg edema on Xarelto going on about 4 months We'll start workup with ultrasound of leg will consider abdominal ultrasound CT etc. if necessary       Other Visit Diagnoses    Immunization due    -  Primary    Relevant Orders    Flu Vaccine QUAD 36+ mos PF IM (Fluarix & Fluzone Quad PF) (Completed)    PE (physical exam), annual             Follow up plan: Return in about 3 months (around 08/29/2015), or if symptoms worsen or fail to improve, for sooner if problems.

## 2015-06-01 LAB — COMPREHENSIVE METABOLIC PANEL
A/G RATIO: 1.3 (ref 1.1–2.5)
ALT: 17 IU/L (ref 0–32)
AST: 18 IU/L (ref 0–40)
Albumin: 4.7 g/dL (ref 3.5–4.8)
Alkaline Phosphatase: 122 IU/L — ABNORMAL HIGH (ref 39–117)
BUN/Creatinine Ratio: 20 (ref 11–26)
BUN: 17 mg/dL (ref 8–27)
Bilirubin Total: 0.9 mg/dL (ref 0.0–1.2)
CALCIUM: 10 mg/dL (ref 8.7–10.3)
CO2: 21 mmol/L (ref 18–29)
CREATININE: 0.87 mg/dL (ref 0.57–1.00)
Chloride: 100 mmol/L (ref 96–106)
GFR, EST AFRICAN AMERICAN: 74 mL/min/{1.73_m2} (ref >59)
GFR, EST NON AFRICAN AMERICAN: 64 mL/min/{1.73_m2} (ref >59)
GLOBULIN, TOTAL: 3.7 g/dL (ref 1.5–4.5)
Glucose: 134 mg/dL — ABNORMAL HIGH (ref 65–99)
Potassium: 4 mmol/L (ref 3.5–5.2)
SODIUM: 146 mmol/L — AB (ref 134–144)
TOTAL PROTEIN: 8.4 g/dL (ref 6.0–8.5)

## 2015-06-01 LAB — LIPID PANEL
CHOL/HDL RATIO: 3.2 ratio (ref 0.0–4.4)
Cholesterol, Total: 159 mg/dL (ref 100–199)
HDL: 49 mg/dL (ref >39)
LDL CALC: 92 mg/dL (ref 0–99)
TRIGLYCERIDES: 92 mg/dL (ref 0–149)
VLDL Cholesterol Cal: 18 mg/dL (ref 5–40)

## 2015-06-01 LAB — CBC WITH DIFFERENTIAL/PLATELET
BASOS ABS: 0 10*3/uL (ref 0.0–0.2)
Basos: 0 %
EOS (ABSOLUTE): 0.2 10*3/uL (ref 0.0–0.4)
Eos: 2 %
Hematocrit: 46 % (ref 34.0–46.6)
Hemoglobin: 14.9 g/dL (ref 11.1–15.9)
Immature Grans (Abs): 0 10*3/uL (ref 0.0–0.1)
Immature Granulocytes: 0 %
LYMPHS ABS: 1 10*3/uL (ref 0.7–3.1)
LYMPHS: 12 %
MCH: 28.4 pg (ref 26.6–33.0)
MCHC: 32.4 g/dL (ref 31.5–35.7)
MCV: 88 fL (ref 79–97)
Monocytes Absolute: 0.7 10*3/uL (ref 0.1–0.9)
Monocytes: 8 %
NEUTROS ABS: 6.5 10*3/uL (ref 1.4–7.0)
Neutrophils: 78 %
PLATELETS: 249 10*3/uL (ref 150–379)
RBC: 5.25 x10E6/uL (ref 3.77–5.28)
RDW: 16.7 % — ABNORMAL HIGH (ref 12.3–15.4)
WBC: 8.5 10*3/uL (ref 3.4–10.8)

## 2015-06-01 LAB — TSH: TSH: 2.36 u[IU]/mL (ref 0.450–4.500)

## 2015-06-04 ENCOUNTER — Encounter: Payer: Self-pay | Admitting: Family Medicine

## 2015-06-06 ENCOUNTER — Other Ambulatory Visit: Payer: Self-pay | Admitting: Family Medicine

## 2015-06-06 DIAGNOSIS — R600 Localized edema: Secondary | ICD-10-CM

## 2015-08-30 ENCOUNTER — Encounter: Payer: Self-pay | Admitting: Family Medicine

## 2015-08-30 ENCOUNTER — Ambulatory Visit (INDEPENDENT_AMBULATORY_CARE_PROVIDER_SITE_OTHER): Payer: Medicare Other | Admitting: Family Medicine

## 2015-08-30 VITALS — BP 137/78 | HR 85 | Temp 98.5°F | Ht 64.2 in | Wt 155.0 lb

## 2015-08-30 DIAGNOSIS — I5020 Unspecified systolic (congestive) heart failure: Secondary | ICD-10-CM

## 2015-08-30 DIAGNOSIS — Z23 Encounter for immunization: Secondary | ICD-10-CM | POA: Diagnosis not present

## 2015-08-30 DIAGNOSIS — I10 Essential (primary) hypertension: Secondary | ICD-10-CM | POA: Diagnosis not present

## 2015-08-30 DIAGNOSIS — J018 Other acute sinusitis: Secondary | ICD-10-CM | POA: Diagnosis not present

## 2015-08-30 MED ORDER — AMOXICILLIN 875 MG PO TABS: 875.0000 mg | ORAL_TABLET | Freq: Two times a day (BID) | ORAL | Status: DC

## 2015-08-30 NOTE — Progress Notes (Signed)
BP 137/78 mmHg  Pulse 85  Temp(Src) 98.5 F (36.9 C)  Ht 5' 4.2" (1.631 m)  Wt 155 lb (70.308 kg)  BMI 26.43 kg/m2  SpO2 95%  LMP  (LMP Unknown)   Subjective:    Patient ID: Julie Tate, female    DOB: 16-Apr-1935, 81 y.o.   MRN: [id number]  HPI: Julie Tate is a 81 y.o. female  Chief Complaint  Patient presents with  . Hypertension  . sinus issues  Patient follow-up hypertension doing well no complaints blood pressures been doing well. Takes medications with no side effects and faithfully. Patient's had allergies and sinus pressure congestion some feeling bad usually requires an antibiotic to get better. Pressure sensation when bending is partially controlled with Claritin now. Patient cut back from potassium 2 a day to 1 a day just because of just taste Will check BMP to see if potassium replacement doing okay No issues with her Xarelto bleeding bruising  Relevant past medical, surgical, family and social history reviewed and updated as indicated. Interim medical history since our last visit reviewed. Allergies and medications reviewed and updated.  Review of Systems  Constitutional: Negative for fever, diaphoresis and fatigue.  HENT: Positive for congestion, postnasal drip, rhinorrhea, sinus pressure and sore throat.   Respiratory: Negative for cough, chest tightness and shortness of breath.   Cardiovascular: Negative for chest pain, palpitations and leg swelling.    Per HPI unless specifically indicated above     Objective:    BP 137/78 mmHg  Pulse 85  Temp(Src) 98.5 F (36.9 C)  Ht 5' 4.2" (1.631 m)  Wt 155 lb (70.308 kg)  BMI 26.43 kg/m2  SpO2 95%  LMP  (LMP Unknown)  Wt Readings from Last 3 Encounters:  08/30/15 155 lb (70.308 kg)  05/31/15 158 lb (71.668 kg)  11/16/14 158 lb 3.2 oz (71.759 kg)    Physical Exam  Constitutional: She is oriented to person, place, and time. She appears well-developed and well-nourished. No distress.  HENT:   Head: Normocephalic and atraumatic.  Right Ear: Hearing and external ear normal.  Left Ear: Hearing and external ear normal.  Nose: Nose normal.  Mouth/Throat: Oropharyngeal exudate present.  Eyes: Conjunctivae and lids are normal. Right eye exhibits no discharge. Left eye exhibits no discharge. No scleral icterus.  Cardiovascular: Normal rate, regular rhythm and normal heart sounds.   Pulmonary/Chest: Effort normal and breath sounds normal. No respiratory distress.  Musculoskeletal: Normal range of motion.  Neurological: She is alert and oriented to person, place, and time.  Skin: Skin is intact. No rash noted.  Psychiatric: She has a normal mood and affect. Her speech is normal and behavior is normal. Judgment and thought content normal. Cognition and memory are normal.    Results for orders placed or performed in visit on 05/31/15  Microscopic Examination  Result Value Ref Range   WBC, UA 0-5 0 -  5 /hpf   RBC, UA 0-2 0 -  2 /hpf   Epithelial Cells (non renal) 0-10 0 - 10 /hpf   Bacteria, UA Few None seen/Few  Comprehensive metabolic panel  Result Value Ref Range   Glucose 134 (H) 65 - 99 mg/dL   BUN 17 8 - 27 mg/dL   Creatinine, Ser 4.090.87 0.57 - 1.00 mg/dL   GFR calc non Af Amer 64 >59 mL/min/1.73   GFR calc Af Amer 74 >59 mL/min/1.73   BUN/Creatinine Ratio 20 11 - 26   Sodium 146 (H) 134 -  144 mmol/L   Potassium 4.0 3.5 - 5.2 mmol/L   Chloride 100 96 - 106 mmol/L   CO2 21 18 - 29 mmol/L   Calcium 10.0 8.7 - 10.3 mg/dL   Total Protein 8.4 6.0 - 8.5 g/dL   Albumin 4.7 3.5 - 4.8 g/dL   Globulin, Total 3.7 1.5 - 4.5 g/dL   Albumin/Globulin Ratio 1.3 1.1 - 2.5   Bilirubin Total 0.9 0.0 - 1.2 mg/dL   Alkaline Phosphatase 122 (H) 39 - 117 IU/L   AST 18 0 - 40 IU/L   ALT 17 0 - 32 IU/L  Lipid panel  Result Value Ref Range   Cholesterol, Total 159 100 - 199 mg/dL   Triglycerides 92 0 - 149 mg/dL   HDL 49 >27 mg/dL   VLDL Cholesterol Cal 18 5 - 40 mg/dL   LDL Calculated 92  0 - 99 mg/dL   Chol/HDL Ratio 3.2 0.0 - 4.4 ratio units  CBC with Differential/Platelet  Result Value Ref Range   WBC 8.5 3.4 - 10.8 x10E3/uL   RBC 5.25 3.77 - 5.28 x10E6/uL   Hemoglobin 14.9 11.1 - 15.9 g/dL   Hematocrit 25.3 66.4 - 46.6 %   MCV 88 79 - 97 fL   MCH 28.4 26.6 - 33.0 pg   MCHC 32.4 31.5 - 35.7 g/dL   RDW 40.3 (H) 47.4 - 25.9 %   Platelets 249 150 - 379 x10E3/uL   Neutrophils 78 %   Lymphs 12 %   Monocytes 8 %   Eos 2 %   Basos 0 %   Neutrophils Absolute 6.5 1.4 - 7.0 x10E3/uL   Lymphocytes Absolute 1.0 0.7 - 3.1 x10E3/uL   Monocytes Absolute 0.7 0.1 - 0.9 x10E3/uL   EOS (ABSOLUTE) 0.2 0.0 - 0.4 x10E3/uL   Basophils Absolute 0.0 0.0 - 0.2 x10E3/uL   Immature Granulocytes 0 %   Immature Grans (Abs) 0.0 0.0 - 0.1 x10E3/uL  Urinalysis, Routine w reflex microscopic (not at [facility])  Result Value Ref Range   Specific Gravity, UA 1.025 1.005 - 1.030   pH, UA 8.5 (H) 5.0 - 7.5   Color, UA Yellow Yellow   Appearance Ur Clear Clear   Leukocytes, UA Negative Negative   Protein, UA 3+ (A) Negative/Trace   Glucose, UA Negative Negative   Ketones, UA Negative Negative   RBC, UA 1+ (A) Negative   Bilirubin, UA Negative Negative   Urobilinogen, Ur 4.0 (H) 0.2 - 1.0 mg/dL   Nitrite, UA Negative Negative   Microscopic Examination See below:   TSH  Result Value Ref Range   TSH 2.360 0.450 - 4.500 uIU/mL      Assessment & Plan:   Problem List Items Addressed This Visit      Cardiovascular and Mediastinum   Hypertension    The current medical regimen is effective;  continue present plan and medications.       Relevant Orders   Basic metabolic panel   CHF (congestive heart failure) (HCC)    The current medical regimen is effective;  continue present plan and medications.         Respiratory   Sinusitis, acute    Allergy symptoms with probable sinusitis Will give patient amoxicillin to hold if getting worse will start.      Relevant Medications   Loratadine  (CLARITIN PO)   amoxicillin (AMOXIL) 875 MG tablet    Other Visit Diagnoses    Immunization due    -  Primary  Relevant Orders    Pneumococcal conjugate vaccine 13-valent IM (Completed)        Follow up plan: Return in about 2 months (around 10/30/2015) for 2-3 mo BMP, CBC,.

## 2015-08-30 NOTE — Patient Instructions (Signed)
Pneumococcal Conjugate Vaccine (PCV13)   1. Why get vaccinated?  Vaccination can protect both children and adults from pneumococcal disease.  Pneumococcal disease is caused by bacteria that can spread from person to person through close contact. It can cause ear infections, and it can also lead to more serious infections of the:  · Lungs (pneumonia),  · Blood (bacteremia), and  · Covering of the brain and spinal cord (meningitis).  Pneumococcal pneumonia is most common among adults. Pneumococcal meningitis can cause deafness and brain damage, and it kills about 1 child in 10 who get it.  Anyone can get pneumococcal disease, but children under 2 years of age and adults 65 years and older, people with certain medical conditions, and cigarette smokers are at the highest risk.  Before there was a vaccine, the [place] saw:  · more than 700 cases of meningitis,  · about 13,000 blood infections,  · about 5 million ear infections, and  · about 200 deaths  in children under 5 each year from pneumococcal disease. Since vaccine became available, severe pneumococcal disease in these children has fallen by 88%.  About 18,000 older adults die of pneumococcal disease each year in the [place].  Treatment of pneumococcal infections with penicillin and other drugs is not as effective as it used to be, because some strains of the disease have become resistant to these drugs. This makes prevention of the disease, through vaccination, even more important.  2. PCV13 vaccine  Pneumococcal conjugate vaccine (called PCV13) protects against 13 types of pneumococcal bacteria.  PCV13 is routinely given to children at 2, 4, 6, and 12-15 months of age. It is also recommended for children and adults 2 to 64 years of age with certain health conditions, and for all adults 65 years of age and older. Your doctor can give you details.  3. Some people should not get this vaccine  Anyone who has ever had a life-threatening allergic reaction  to a dose of this vaccine, to an earlier pneumococcal vaccine called PCV7, or to any vaccine containing diphtheria toxoid (for example, DTaP), should not get PCV13.  Anyone with a severe allergy to any component of PCV13 should not get the vaccine. Tell your doctor if the person being vaccinated has any severe allergies.  If the person scheduled for vaccination is not feeling well, your healthcare provider might decide to reschedule the shot on another day.  4. Risks of a vaccine reaction  With any medicine, including vaccines, there is a chance of reactions. These are usually mild and go away on their own, but serious reactions are also possible.  Problems reported following PCV13 varied by age and dose in the series. The most common problems reported among children were:  · About half became drowsy after the shot, had a temporary loss of appetite, or had redness or tenderness where the shot was given.  · About 1 out of 3 had swelling where the shot was given.  · About 1 out of 3 had a mild fever, and about 1 in 20 had a fever over 102.2°F.  · Up to about 8 out of 10 became fussy or irritable.  Adults have reported pain, redness, and swelling where the shot was given; also mild fever, fatigue, headache, chills, or muscle pain.  Young children who get PCV13 along with inactivated flu vaccine at the same time may be at increased risk for seizures caused by fever. Ask your doctor for more information.  Problems that   could happen after any vaccine:  · People sometimes faint after a medical procedure, including vaccination. Sitting or lying down for about 15 minutes can help prevent fainting, and injuries caused by a fall. Tell your doctor if you feel dizzy, or have vision changes or ringing in the ears.  · Some older children and adults get severe pain in the shoulder and have difficulty moving the arm where a shot was given. This happens very rarely.  · Any medication can cause a severe allergic reaction. Such  reactions from a vaccine are very rare, estimated at about 1 in a million doses, and would happen within a few minutes to a few hours after the vaccination.  As with any medicine, there is a very small chance of a vaccine causing a serious injury or death.  The safety of vaccines is always being monitored. For more information, visit: www.cdc.gov/vaccinesafety/  5. What if there is a serious reaction?  What should I look for?  · Look for anything that concerns you, such as signs of a severe allergic reaction, very high fever, or unusual behavior.  Signs of a severe allergic reaction can include hives, swelling of the face and throat, difficulty breathing, a fast heartbeat, dizziness, and weakness-usually within a few minutes to a few hours after the vaccination.  What should I do?  · If you think it is a severe allergic reaction or other emergency that can't wait, call 9-1-1 or get the person to the nearest hospital. Otherwise, call your doctor.  Reactions should be reported to the Vaccine Adverse Event Reporting System (VAERS). Your doctor should file this report, or you can do it yourself through the VAERS web site at www.vaers.hhs.gov, or by calling 1-800-822-7967.  VAERS does not give medical advice.  6. The National Vaccine Injury Compensation Program  The National Vaccine Injury Compensation Program (VICP) is a federal program that was created to compensate people who may have been injured by certain vaccines.  Persons who believe they may have been injured by a vaccine can learn about the program and about filing a claim by calling 1-800-338-2382 or visiting the VICP website at www.hrsa.gov/vaccinecompensation. There is a time limit to file a claim for compensation.  7. How can I learn more?  · Ask your healthcare provider. He or she can give you the vaccine package insert or suggest other sources of information.  · Call your local or state health department.  · Contact the Centers for Disease Control and  Prevention (CDC):    Call 1-800-232-4636 (1-800-CDC-INFO) or    Visit CDC's website at www.cdc.gov/vaccines  Vaccine Information Statement  PCV13 Vaccine (03/09/2014)     This information is not intended to replace advice given to you by your health care provider. Make sure you discuss any questions you have with your health care provider.     Document Released: 02/16/2006 Document Revised: 05/12/2014 Document Reviewed: 03/16/2014  Elsevier Interactive Patient Education ©2016 [employer]

## 2015-08-30 NOTE — Assessment & Plan Note (Signed)
Allergy symptoms with probable sinusitis Will give patient amoxicillin to hold if getting worse will start.

## 2015-08-30 NOTE — Assessment & Plan Note (Signed)
The current medical regimen is effective;  continue present plan and medications.  

## 2015-08-31 LAB — BASIC METABOLIC PANEL
BUN/Creatinine Ratio: 15 (ref 12–28)
BUN: 14 mg/dL (ref 8–27)
CALCIUM: 9.9 mg/dL (ref 8.7–10.3)
CO2: 26 mmol/L (ref 18–29)
CREATININE: 0.94 mg/dL (ref 0.57–1.00)
Chloride: 96 mmol/L (ref 96–106)
GFR calc Af Amer: 67 mL/min/{1.73_m2} (ref >59)
GFR, EST NON AFRICAN AMERICAN: 58 mL/min/{1.73_m2} — AB (ref >59)
Glucose: 150 mg/dL — ABNORMAL HIGH (ref 65–99)
POTASSIUM: 4 mmol/L (ref 3.5–5.2)
Sodium: 142 mmol/L (ref 134–144)

## 2015-09-03 ENCOUNTER — Telehealth: Payer: Self-pay | Admitting: Family Medicine

## 2015-09-03 DIAGNOSIS — R7309 Other abnormal glucose: Secondary | ICD-10-CM

## 2015-09-03 NOTE — Telephone Encounter (Signed)
Phone call Discussed with patient elevated nonfasting glucose will come back next week or so for hemoglobin A1c.

## 2015-09-12 ENCOUNTER — Other Ambulatory Visit: Payer: Self-pay | Admitting: Family Medicine

## 2015-09-17 ENCOUNTER — Other Ambulatory Visit: Payer: Medicare Other

## 2015-09-17 DIAGNOSIS — R7309 Other abnormal glucose: Secondary | ICD-10-CM

## 2015-09-17 LAB — BAYER DCA HB A1C WAIVED: HB A1C: 6.2 % (ref <7.0)

## 2015-10-08 ENCOUNTER — Other Ambulatory Visit: Payer: Self-pay | Admitting: Family Medicine

## 2015-10-12 ENCOUNTER — Telehealth: Payer: Self-pay | Admitting: Family Medicine

## 2015-10-12 ENCOUNTER — Telehealth: Payer: Self-pay

## 2015-10-12 NOTE — Telephone Encounter (Signed)
Called pharmacy because according to chart, patient should still have refills. Pharmacy stated that the patient does have 5 refills but needs a PA done first. So I completed the PA for this medication.

## 2015-10-12 NOTE — Telephone Encounter (Signed)
Called and let the patient know that we had completed the PA and are waiting on it to be approved. I let her know that once it is approved, the pharmacy will be notified and will fill her medication.

## 2015-10-12 NOTE — Telephone Encounter (Signed)
Pt called stated she needs a refill in Xarelto. [facility] [place]. Pt stated she is on her last pill. Please call pt once RX is sent. Thanks.

## 2015-10-12 NOTE — Telephone Encounter (Signed)
After patient's xarelto was declined, I spoke with Dr. Laural BenesJohnson about it since Dr. Dossie Arbourrissman is out of the office. She asked me to call the pharmacy and see what could be covered so I did. Pharmacy tried 2 different medications that were also needing a PA. Pharmacy told me that they were going to call the insurance company and call us back.

## 2015-10-17 ENCOUNTER — Telehealth: Payer: Self-pay | Admitting: Family Medicine

## 2015-10-17 NOTE — Telephone Encounter (Signed)
Prior Authorization Code [id number]  Good 09-17-15 thru 10-16-16  [facility]

## 2015-10-17 NOTE — Telephone Encounter (Signed)
Pt called stated she needs her Xarelto. Please call her ASAP. Thanks.

## 2015-10-30 ENCOUNTER — Ambulatory Visit (INDEPENDENT_AMBULATORY_CARE_PROVIDER_SITE_OTHER): Payer: Medicare Other | Admitting: Family Medicine

## 2015-10-30 ENCOUNTER — Other Ambulatory Visit: Payer: Self-pay | Admitting: Family Medicine

## 2015-10-30 ENCOUNTER — Encounter: Payer: Self-pay | Admitting: Family Medicine

## 2015-10-30 VITALS — BP 142/60 | HR 76 | Temp 98.5°F | Ht 64.2 in | Wt 158.0 lb

## 2015-10-30 DIAGNOSIS — I5020 Unspecified systolic (congestive) heart failure: Secondary | ICD-10-CM

## 2015-10-30 DIAGNOSIS — I480 Paroxysmal atrial fibrillation: Secondary | ICD-10-CM | POA: Diagnosis not present

## 2015-10-30 DIAGNOSIS — I129 Hypertensive chronic kidney disease with stage 1 through stage 4 chronic kidney disease, or unspecified chronic kidney disease: Secondary | ICD-10-CM | POA: Diagnosis not present

## 2015-10-30 NOTE — Assessment & Plan Note (Signed)
Rate controlled. Normal rhythm today. No bruising or bleeding on xarelto. Continue current regimen and continue to monitor.

## 2015-10-30 NOTE — Progress Notes (Signed)
BP 142/60 mmHg  Pulse 76  Temp(Src) 98.5 F (36.9 C)  Ht 5' 4.2" (1.631 m)  Wt 158 lb (71.668 kg)  BMI 26.94 kg/m2  SpO2 89%  LMP  (LMP Unknown)   Subjective:    Patient ID: Julie Tate, female    DOB: [birth year], 81 y.o.   MRN: [id number]  HPI: Julie Tate is a 81 y.o. female  Chief Complaint  Patient presents with  . Hypertension    Patient states that she has not taken her BP medication this morning   Feeling pretty good. Has been having swelling in her ankles but no more than usual.  Did not take her BP medicine this morning  HYPERTENSION Hypertension status: Didn't take her medicine today  Satisfied with current treatment? yes Duration of hypertension: chronic BP monitoring frequency:  a few times a week BP medication side effects:  no Medication compliance: excellent compliance Aspirin: no Recurrent headaches: no Visual changes: no Palpitations: yes Dyspnea: no Chest pain: no Lower extremity edema: yes Dizzy/lightheaded: no  Doing well with the xarelto, no bruising or bleeding  Relevant past medical, surgical, family and social history reviewed and updated as indicated. Interim medical history since our last visit reviewed. Allergies and medications reviewed and updated.  Review of Systems  Constitutional: Negative.   Respiratory: Negative.   Cardiovascular: Positive for palpitations and leg swelling. Negative for chest pain.  Genitourinary: Positive for difficulty urinating.  Hematological: Negative.   Psychiatric/Behavioral: Negative.     Per HPI unless specifically indicated above     Objective:    BP 142/60 mmHg  Pulse 76  Temp(Src) 98.5 F (36.9 C)  Ht 5' 4.2" (1.631 m)  Wt 158 lb (71.668 kg)  BMI 26.94 kg/m2  SpO2 89%  LMP  (LMP Unknown)  Wt Readings from Last 3 Encounters:  10/30/15 158 lb (71.668 kg)  08/30/15 155 lb (70.308 kg)  05/31/15 158 lb (71.668 kg)    Physical Exam  Constitutional: She is oriented to person,  place, and time. She appears well-developed and well-nourished. No distress.  HENT:  Head: Normocephalic and atraumatic.  Right Ear: Hearing and external ear normal.  Left Ear: Hearing and external ear normal.  Nose: Nose normal.  Mouth/Throat: Oropharynx is clear and moist. No oropharyngeal exudate.  Eyes: Conjunctivae, EOM and lids are normal. Pupils are equal, round, and reactive to light. Right eye exhibits no discharge. Left eye exhibits no discharge. No scleral icterus.  Cardiovascular: Normal rate, regular rhythm, normal heart sounds and intact distal pulses.  Exam reveals no gallop and no friction rub.   No murmur heard. Pulmonary/Chest: Effort normal. No respiratory distress. She has decreased breath sounds. She has no wheezes. She has no rales. She exhibits no tenderness.  Musculoskeletal: Normal range of motion. She exhibits edema (2+ on the L).  Neurological: She is alert and oriented to person, place, and time.  Skin: Skin is warm, dry and intact. No rash noted. She is not diaphoretic. No erythema. No pallor.  Psychiatric: She has a normal mood and affect. Her speech is normal and behavior is normal. Judgment and thought content normal. Cognition and memory are normal.  Nursing note and vitals reviewed.   Results for orders placed or performed in visit on 09/17/15  Bayer DCA Hb A1c Waived  Result Value Ref Range   Bayer DCA Hb A1c Waived 6.2 <7.0 %      Assessment & Plan:   Problem List Items Addressed This Visit  Cardiovascular and Mediastinum   Atrial fibrillation (HCC) - Primary    Rate controlled. Normal rhythm today. No bruising or bleeding on xarelto. Continue current regimen and continue to monitor.       CHF (congestive heart failure) (HCC)    Stable on current regimen. No concerns. Continue current regimen and continue to monitor. Rechecking CBC and BMP today        Genitourinary   Benign hypertensive renal disease    Better on recheck. Did not take  medicine today. Will recheck CBC and BMP. Call with concerns.           Follow up plan: Return in about 3 months (around 01/30/2016) for Follow up with [facility].

## 2015-10-30 NOTE — Assessment & Plan Note (Signed)
Stable on current regimen. No concerns. Continue current regimen and continue to monitor. Rechecking CBC and BMP today

## 2015-10-30 NOTE — Assessment & Plan Note (Signed)
Better on recheck. Did not take medicine today. Will recheck CBC and BMP. Call with concerns.

## 2015-12-09 ENCOUNTER — Other Ambulatory Visit: Payer: Self-pay | Admitting: Family Medicine

## 2015-12-09 DIAGNOSIS — J018 Other acute sinusitis: Secondary | ICD-10-CM

## 2015-12-10 ENCOUNTER — Telehealth: Payer: Self-pay | Admitting: Family Medicine

## 2015-12-10 ENCOUNTER — Other Ambulatory Visit: Payer: Self-pay | Admitting: Family Medicine

## 2015-12-10 DIAGNOSIS — J018 Other acute sinusitis: Secondary | ICD-10-CM

## 2015-12-10 MED ORDER — AMOXICILLIN 875 MG PO TABS: 875.0000 mg | ORAL_TABLET | Freq: Two times a day (BID) | ORAL | 0 refills | Status: DC

## 2015-12-10 NOTE — Telephone Encounter (Signed)
Pt states she has a real bad sinus infection and would like Dr Dossie Arbourrissman to call something in for her.  She did not want to make an appt as he usually just calls it in for her.

## 2016-01-02 ENCOUNTER — Other Ambulatory Visit: Payer: Self-pay | Admitting: Family Medicine

## 2016-02-04 ENCOUNTER — Ambulatory Visit (INDEPENDENT_AMBULATORY_CARE_PROVIDER_SITE_OTHER): Payer: Medicare Other | Admitting: Family Medicine

## 2016-02-04 ENCOUNTER — Encounter: Payer: Self-pay | Admitting: Family Medicine

## 2016-02-04 VITALS — BP 134/80 | HR 82 | Temp 98.0°F | Wt 161.0 lb

## 2016-02-04 DIAGNOSIS — I130 Hypertensive heart and chronic kidney disease with heart failure and stage 1 through stage 4 chronic kidney disease, or unspecified chronic kidney disease: Secondary | ICD-10-CM

## 2016-02-04 DIAGNOSIS — I480 Paroxysmal atrial fibrillation: Secondary | ICD-10-CM

## 2016-02-04 DIAGNOSIS — J45909 Unspecified asthma, uncomplicated: Secondary | ICD-10-CM

## 2016-02-04 DIAGNOSIS — Z23 Encounter for immunization: Secondary | ICD-10-CM | POA: Diagnosis not present

## 2016-02-04 DIAGNOSIS — I5020 Unspecified systolic (congestive) heart failure: Secondary | ICD-10-CM

## 2016-02-04 MED ORDER — RIVAROXABAN 15 MG PO TABS
15.0000 mg | ORAL_TABLET | Freq: Every day | ORAL | 7 refills | Status: DC
Start: 2016-02-04 — End: 2016-06-04

## 2016-02-04 MED ORDER — TIOTROPIUM BROMIDE MONOHYDRATE 18 MCG IN CAPS: 18.0000 ug | ORAL_CAPSULE | Freq: Every day | RESPIRATORY_TRACT | 7 refills | Status: DC

## 2016-02-04 MED ORDER — CARVEDILOL 25 MG PO TABS: 25.0000 mg | ORAL_TABLET | Freq: Two times a day (BID) | ORAL | 7 refills | Status: DC

## 2016-02-04 MED ORDER — FUROSEMIDE 20 MG PO TABS: 20.0000 mg | ORAL_TABLET | Freq: Two times a day (BID) | ORAL | 4 refills | Status: DC

## 2016-02-04 MED ORDER — POTASSIUM CHLORIDE ER 8 MEQ PO TBCR: 8.0000 meq | EXTENDED_RELEASE_TABLET | Freq: Every day | ORAL | 4 refills | Status: DC

## 2016-02-04 NOTE — Assessment & Plan Note (Signed)
No bleeding bruising issues with Xarelto

## 2016-02-04 NOTE — Assessment & Plan Note (Signed)
Patient doing well no complaints discuss timing of Lasix may do one in the morning and one at noon okay to forget 1 if doing well. No fluid retention

## 2016-02-04 NOTE — Progress Notes (Signed)
BP 134/80 (BP Location: Right Arm, Patient Position: Standing, Cuff Size: Small)   Pulse 82   Temp 98 F (36.7 C)   Wt 161 lb (73 kg) Comment: with shoes  LMP  (LMP Unknown)   SpO2 95%   BMI 27.46 kg/m    Subjective:    Patient ID: Julie Tate, female    DOB: [birth year], 81 y.o.   MRN: [id number]  HPI: Julie Tate is a 81 y.o. female  Chief Complaint  Patient presents with  . Congestive Heart Failure   Patient all in all doing well often forgets p.m. Lasix but sleeping all night without PND orthopnea not having issues with edema no rapid heartbeat All in all feeling well Good blood pressure No bruising bleeding issues with blood thinner Doing fine with Spiriva for breathing no issues Relevant past medical, surgical, family and social history reviewed and updated as indicated. Interim medical history since our last visit reviewed. Allergies and medications reviewed and updated.  Review of Systems  Constitutional: Negative.   Respiratory: Negative.  Negative for cough and choking.   Cardiovascular: Positive for leg swelling. Negative for chest pain.    Per HPI unless specifically indicated above     Objective:    BP 134/80 (BP Location: Right Arm, Patient Position: Standing, Cuff Size: Small)   Pulse 82   Temp 98 F (36.7 C)   Wt 161 lb (73 kg) Comment: with shoes  LMP  (LMP Unknown)   SpO2 95%   BMI 27.46 kg/m   Wt Readings from Last 3 Encounters:  02/04/16 161 lb (73 kg)  10/30/15 158 lb (71.7 kg)  08/30/15 155 lb (70.3 kg)    Physical Exam  Constitutional: She is oriented to person, place, and time. She appears well-developed and well-nourished. No distress.  HENT:  Head: Normocephalic and atraumatic.  Right Ear: Hearing normal.  Left Ear: Hearing normal.  Nose: Nose normal.  Eyes: Conjunctivae and lids are normal. Right eye exhibits no discharge. Left eye exhibits no discharge. No scleral icterus.  Cardiovascular: Normal rate and normal  heart sounds.   Pulmonary/Chest: Effort normal and breath sounds normal. No respiratory distress.  Musculoskeletal: Normal range of motion.  Neurological: She is alert and oriented to person, place, and time.  Skin: Skin is intact. No rash noted.  Psychiatric: She has a normal mood and affect. Her speech is normal and behavior is normal. Judgment and thought content normal. Cognition and memory are normal.    Results for orders placed or performed in visit on 09/17/15  Bayer DCA Hb A1c Waived  Result Value Ref Range   Bayer DCA Hb A1c Waived 6.2 <7.0 %      Assessment & Plan:   Problem List Items Addressed This Visit      Cardiovascular and Mediastinum   Atrial fibrillation (HCC)    No bleeding bruising issues with Xarelto      Relevant Medications   furosemide (LASIX) 20 MG tablet   Rivaroxaban (XARELTO) 15 MG TABS tablet   carvedilol (COREG) 25 MG tablet   CHF (congestive heart failure) (HCC)    Patient doing well no complaints discuss timing of Lasix may do one in the morning and one at noon okay to forget 1 if doing well. No fluid retention      Relevant Medications   furosemide (LASIX) 20 MG tablet   Rivaroxaban (XARELTO) 15 MG TABS tablet   carvedilol (COREG) 25 MG tablet     Respiratory  Asthma    The current medical regimen is effective;  continue present plan and medications.       Relevant Medications   tiotropium (SPIRIVA HANDIHALER) 18 MCG inhalation capsule    Other Visit Diagnoses    Immunization due    -  Primary   Relevant Orders   Flu vaccine HIGH DOSE PF (Fluzone High dose) (Completed)   Hypertensive heart and renal disease, stage 1-4 or unspecified chronic kidney disease, with heart failure (HCC)       Relevant Medications   furosemide (LASIX) 20 MG tablet   Rivaroxaban (XARELTO) 15 MG TABS tablet   carvedilol (COREG) 25 MG tablet       Follow up plan: Return in about 3 months (around 05/06/2016) for Physical Exam.

## 2016-02-04 NOTE — Patient Instructions (Addendum)

## 2016-02-04 NOTE — Assessment & Plan Note (Signed)
The current medical regimen is effective;  continue present plan and medications.  

## 2016-02-05 ENCOUNTER — Encounter: Payer: Self-pay | Admitting: Family Medicine

## 2016-02-05 LAB — CBC WITH DIFFERENTIAL/PLATELET
BASOS ABS: 0 10*3/uL (ref 0.0–0.2)
Basos: 0 %
EOS (ABSOLUTE): 0.2 10*3/uL (ref 0.0–0.4)
EOS: 3 %
HEMATOCRIT: 44.9 % (ref 34.0–46.6)
Hemoglobin: 14.7 g/dL (ref 11.1–15.9)
IMMATURE GRANULOCYTES: 0 %
Immature Grans (Abs): 0 10*3/uL (ref 0.0–0.1)
Lymphocytes Absolute: 1.2 10*3/uL (ref 0.7–3.1)
Lymphs: 16 %
MCH: 29.1 pg (ref 26.6–33.0)
MCHC: 32.7 g/dL (ref 31.5–35.7)
MCV: 89 fL (ref 79–97)
MONOS ABS: 0.8 10*3/uL (ref 0.1–0.9)
Monocytes: 11 %
NEUTROS PCT: 70 %
Neutrophils Absolute: 5.1 10*3/uL (ref 1.4–7.0)
PLATELETS: 228 10*3/uL (ref 150–379)
RBC: 5.05 x10E6/uL (ref 3.77–5.28)
RDW: 15.6 % — AB (ref 12.3–15.4)
WBC: 7.3 10*3/uL (ref 3.4–10.8)

## 2016-02-05 LAB — BASIC METABOLIC PANEL
BUN/Creatinine Ratio: 16 (ref 12–28)
BUN: 15 mg/dL (ref 8–27)
CALCIUM: 9.7 mg/dL (ref 8.7–10.3)
CHLORIDE: 97 mmol/L (ref 96–106)
CO2: 26 mmol/L (ref 18–29)
Creatinine, Ser: 0.93 mg/dL (ref 0.57–1.00)
GFR calc Af Amer: 68 mL/min/{1.73_m2} (ref >59)
GFR, EST NON AFRICAN AMERICAN: 59 mL/min/{1.73_m2} — AB (ref >59)
GLUCOSE: 120 mg/dL — AB (ref 65–99)
POTASSIUM: 4.1 mmol/L (ref 3.5–5.2)
SODIUM: 142 mmol/L (ref 134–144)

## 2016-05-03 ENCOUNTER — Other Ambulatory Visit: Payer: Self-pay | Admitting: Family Medicine

## 2016-05-06 NOTE — Telephone Encounter (Signed)
Last OV: 02/04/16 Next OV: 06/03/16

## 2016-05-23 ENCOUNTER — Other Ambulatory Visit: Payer: Self-pay | Admitting: Family Medicine

## 2016-05-23 NOTE — Telephone Encounter (Signed)
Routing to provider  

## 2016-06-03 ENCOUNTER — Encounter: Payer: Medicare Other | Admitting: Family Medicine

## 2016-06-04 ENCOUNTER — Ambulatory Visit (INDEPENDENT_AMBULATORY_CARE_PROVIDER_SITE_OTHER): Payer: Medicare Other | Admitting: Family Medicine

## 2016-06-04 ENCOUNTER — Encounter: Payer: Self-pay | Admitting: Family Medicine

## 2016-06-04 VITALS — BP 130/80 | HR 81 | Ht 65.0 in | Wt 164.0 lb

## 2016-06-04 DIAGNOSIS — Z1329 Encounter for screening for other suspected endocrine disorder: Secondary | ICD-10-CM | POA: Diagnosis not present

## 2016-06-04 DIAGNOSIS — Z13220 Encounter for screening for lipoid disorders: Secondary | ICD-10-CM | POA: Diagnosis not present

## 2016-06-04 DIAGNOSIS — N183 Chronic kidney disease, stage 3 unspecified: Secondary | ICD-10-CM

## 2016-06-04 DIAGNOSIS — I480 Paroxysmal atrial fibrillation: Secondary | ICD-10-CM | POA: Diagnosis not present

## 2016-06-04 DIAGNOSIS — I130 Hypertensive heart and chronic kidney disease with heart failure and stage 1 through stage 4 chronic kidney disease, or unspecified chronic kidney disease: Secondary | ICD-10-CM

## 2016-06-04 DIAGNOSIS — I5020 Unspecified systolic (congestive) heart failure: Secondary | ICD-10-CM

## 2016-06-04 DIAGNOSIS — J018 Other acute sinusitis: Secondary | ICD-10-CM | POA: Diagnosis not present

## 2016-06-04 DIAGNOSIS — R600 Localized edema: Secondary | ICD-10-CM | POA: Diagnosis not present

## 2016-06-04 DIAGNOSIS — Z0000 Encounter for general adult medical examination without abnormal findings: Secondary | ICD-10-CM

## 2016-06-04 MED ORDER — RIVAROXABAN 15 MG PO TABS: 15.0000 mg | ORAL_TABLET | Freq: Every day | ORAL | 7 refills | Status: DC

## 2016-06-04 MED ORDER — DILTIAZEM HCL ER COATED BEADS 240 MG PO CP24: 240.0000 mg | ORAL_CAPSULE | Freq: Every day | ORAL | 12 refills | Status: AC

## 2016-06-04 MED ORDER — CARVEDILOL 25 MG PO TABS: 25.0000 mg | ORAL_TABLET | Freq: Two times a day (BID) | ORAL | 12 refills | Status: AC

## 2016-06-04 MED ORDER — POTASSIUM CHLORIDE ER 8 MEQ PO TBCR: 8.0000 meq | EXTENDED_RELEASE_TABLET | Freq: Every day | ORAL | 12 refills | Status: AC

## 2016-06-04 MED ORDER — TIOTROPIUM BROMIDE MONOHYDRATE 18 MCG IN CAPS: 18.0000 ug | ORAL_CAPSULE | Freq: Every day | RESPIRATORY_TRACT | 12 refills | Status: AC

## 2016-06-04 MED ORDER — AMOXICILLIN 875 MG PO TABS: 875.0000 mg | ORAL_TABLET | Freq: Two times a day (BID) | ORAL | 0 refills | Status: DC

## 2016-06-04 MED ORDER — FUROSEMIDE 20 MG PO TABS: 20.0000 mg | ORAL_TABLET | Freq: Two times a day (BID) | ORAL | 12 refills | Status: AC

## 2016-06-04 NOTE — Progress Notes (Signed)
BP 130/80 (BP Location: Left Arm)   Pulse 81   Ht 5\' 5"  (1.651 m)   Wt 164 lb (74.4 kg)   LMP  (LMP Unknown)   SpO2 91%   BMI 27.29 kg/m    Subjective:    Patient ID: Julie Tate, female    DOB: November 13, 1934, 82 y.o.   MRN: [id number]  HPI: Julie ParkinsDoris D Stiner is a 82 y.o. female  Chief Complaint  Patient presents with  . Annual Exam  . Ear Drainage  . Nausea  Patient with sinus drainage sinus pressure fever, just feeling bad achiness going on for at least a week or more. Has tried Tylenol over-the-counter with Claritin and Nasacort. Only with minimal relief. Blood pressures been high patient's taking her medicines faithfully took it this morning. Takes blood thinner with no bleeding or bruising issues. Takes other medications out problems or issues. Has some itching rash under her breasts.'s been using some powder  Relevant past medical, surgical, family and social history reviewed and updated as indicated. Interim medical history since our last visit reviewed. Allergies and medications reviewed and updated.  Review of Systems  Constitutional: Negative.   HENT: Negative.   Eyes: Negative.   Respiratory: Negative.   Cardiovascular: Negative.   Gastrointestinal: Negative.   Endocrine: Negative.   Genitourinary: Negative.   Musculoskeletal: Negative.   Skin: Negative.   Allergic/Immunologic: Negative.   Neurological: Negative.   Hematological: Negative.   Psychiatric/Behavioral: Negative.     Per HPI unless specifically indicated above     Objective:    BP 130/80 (BP Location: Left Arm)   Pulse 81   Ht 5\' 5"  (1.651 m)   Wt 164 lb (74.4 kg)   LMP  (LMP Unknown)   SpO2 91%   BMI 27.29 kg/m   Wt Readings from Last 3 Encounters:  06/04/16 164 lb (74.4 kg)  02/04/16 161 lb (73 kg)  10/30/15 158 lb (71.7 kg)    Physical Exam  Constitutional: She is oriented to person, place, and time. She appears well-developed and well-nourished.  HENT:  Head:  Normocephalic and atraumatic.  Right Ear: External ear normal.  Left Ear: External ear normal.  Nose: Nose normal.  Mouth/Throat: Oropharynx is clear and moist.  Eyes: Conjunctivae and EOM are normal. Pupils are equal, round, and reactive to light.  Neck: Normal range of motion. Neck supple. Carotid bruit is not present.  Cardiovascular: Normal rate, regular rhythm and normal heart sounds.   No murmur heard. Pulmonary/Chest: Effort normal and breath sounds normal. She exhibits no mass. Right breast exhibits no mass, no skin change and no tenderness. Left breast exhibits no mass, no skin change and no tenderness. Breasts are symmetrical.  Abdominal: Soft. Bowel sounds are normal. There is no hepatosplenomegaly.  Musculoskeletal: Normal range of motion.  Neurological: She is alert and oriented to person, place, and time.  Skin: No rash noted.  Psychiatric: She has a normal mood and affect. Her behavior is normal. Judgment and thought content normal.    Results for orders placed or performed in visit on 02/04/16  CBC with Differential/Platelet  Result Value Ref Range   WBC 7.3 3.4 - 10.8 x10E3/uL   RBC 5.05 3.77 - 5.28 x10E6/uL   Hemoglobin 14.7 11.1 - 15.9 g/dL   Hematocrit 82.944.9 56.234.0 - 46.6 %   MCV 89 79 - 97 fL   MCH 29.1 26.6 - 33.0 pg   MCHC 32.7 31.5 - 35.7 g/dL   RDW 13.015.6 (H)  12.3 - 15.4 %   Platelets 228 150 - 379 x10E3/uL   Neutrophils 70 Not Estab. %   Lymphs 16 Not Estab. %   Monocytes 11 Not Estab. %   Eos 3 Not Estab. %   Basos 0 Not Estab. %   Neutrophils Absolute 5.1 1.4 - 7.0 x10E3/uL   Lymphocytes Absolute 1.2 0.7 - 3.1 x10E3/uL   Monocytes Absolute 0.8 0.1 - 0.9 x10E3/uL   EOS (ABSOLUTE) 0.2 0.0 - 0.4 x10E3/uL   Basophils Absolute 0.0 0.0 - 0.2 x10E3/uL   Immature Granulocytes 0 Not Estab. %   Immature Grans (Abs) 0.0 0.0 - 0.1 x10E3/uL  Basic metabolic panel  Result Value Ref Range   Glucose 120 (H) 65 - 99 mg/dL   BUN 15 8 - 27 mg/dL   Creatinine, Ser 1.61  0.57 - 1.00 mg/dL   GFR calc non Af Amer 59 (L) >59 mL/min/1.73   GFR calc Af Amer 68 >59 mL/min/1.73   BUN/Creatinine Ratio 16 12 - 28   Sodium 142 134 - 144 mmol/L   Potassium 4.1 3.5 - 5.2 mmol/L   Chloride 97 96 - 106 mmol/L   CO2 26 18 - 29 mmol/L   Calcium 9.7 8.7 - 10.3 mg/dL      Assessment & Plan:   Problem List Items Addressed This Visit      Cardiovascular and Mediastinum   Atrial fibrillation (HCC)    The current medical regimen is effective;  continue present plan and medications.       Relevant Medications   carvedilol (COREG) 25 MG tablet   diltiazem (CARDIZEM CD) 240 MG 24 hr capsule   furosemide (LASIX) 20 MG tablet   Rivaroxaban (XARELTO) 15 MG TABS tablet   CHF (congestive heart failure) (HCC)    The current medical regimen is effective;  continue present plan and medications. Does have some excess edema patient will take extra fluid pill and this is an issue.      Relevant Medications   carvedilol (COREG) 25 MG tablet   diltiazem (CARDIZEM CD) 240 MG 24 hr capsule   furosemide (LASIX) 20 MG tablet   potassium chloride (KLOR-CON) 8 MEQ tablet   Rivaroxaban (XARELTO) 15 MG TABS tablet   Hypertensive heart and renal disease    The current medical regimen is effective;  continue present plan and medications.       Relevant Medications   carvedilol (COREG) 25 MG tablet   diltiazem (CARDIZEM CD) 240 MG 24 hr capsule   furosemide (LASIX) 20 MG tablet   potassium chloride (KLOR-CON) 8 MEQ tablet   Rivaroxaban (XARELTO) 15 MG TABS tablet   Other Relevant Orders   CBC with Differential/Platelet   Comprehensive metabolic panel   Urinalysis, Routine w reflex microscopic     Respiratory   Sinusitis, acute    Discussed sinusitis care and treatment will give Amoxil 875 twice a day use of Mucinex Tylenol etc.        Genitourinary   CKD (chronic kidney disease), stage III   Relevant Orders   Urinalysis, Routine w reflex microscopic     Other   Leg  edema, left    Wears compression does elevation will continue current care       Other Visit Diagnoses    Annual physical exam    -  Primary   Relevant Orders   CBC with Differential/Platelet   Comprehensive metabolic panel   Lipid panel   TSH   Urinalysis, Routine w  reflex microscopic   Screening cholesterol level       Relevant Orders   Lipid panel   Thyroid disorder screen       Relevant Orders   TSH   Hypertensive heart and renal disease, stage 1-4 or unspecified chronic kidney disease, with heart failure (HCC)       Relevant Medications   carvedilol (COREG) 25 MG tablet   diltiazem (CARDIZEM CD) 240 MG 24 hr capsule   furosemide (LASIX) 20 MG tablet   Rivaroxaban (XARELTO) 15 MG TABS tablet       Follow up plan: Return for BMP,  CBC.

## 2016-06-04 NOTE — Assessment & Plan Note (Addendum)
The current medical regimen is effective;  continue present plan and medications. Does have some excess edema patient will take extra fluid pill and this is an issue.

## 2016-06-04 NOTE — Assessment & Plan Note (Signed)
The current medical regimen is effective;  continue present plan and medications.  

## 2016-06-04 NOTE — Addendum Note (Signed)
Addended byVonita Moss: Kadin Canipe on: 06/04/2016 10:25 AM   Modules accepted: Orders

## 2016-06-04 NOTE — Assessment & Plan Note (Signed)
Wears compression does elevation will continue current care

## 2016-06-04 NOTE — Assessment & Plan Note (Signed)
Discussed sinusitis care and treatment will give Amoxil 875 twice a day use of Mucinex Tylenol etc.

## 2016-06-05 ENCOUNTER — Encounter: Payer: Self-pay | Admitting: Family Medicine

## 2016-06-05 LAB — CBC WITH DIFFERENTIAL/PLATELET
Basophils Absolute: 0 10*3/uL (ref 0.0–0.2)
Basos: 0 %
EOS (ABSOLUTE): 0.2 10*3/uL (ref 0.0–0.4)
EOS: 3 %
HEMOGLOBIN: 14.5 g/dL (ref 11.1–15.9)
Hematocrit: 44.1 % (ref 34.0–46.6)
IMMATURE GRANS (ABS): 0 10*3/uL (ref 0.0–0.1)
Immature Granulocytes: 0 %
LYMPHS ABS: 0.9 10*3/uL (ref 0.7–3.1)
LYMPHS: 12 %
MCH: 28.3 pg (ref 26.6–33.0)
MCHC: 32.9 g/dL (ref 31.5–35.7)
MCV: 86 fL (ref 79–97)
MONOCYTES: 11 %
Monocytes Absolute: 0.7 10*3/uL (ref 0.1–0.9)
NEUTROS ABS: 5.2 10*3/uL (ref 1.4–7.0)
Neutrophils: 74 %
Platelets: 216 10*3/uL (ref 150–379)
RBC: 5.13 x10E6/uL (ref 3.77–5.28)
RDW: 16.4 % — ABNORMAL HIGH (ref 12.3–15.4)
WBC: 7.1 10*3/uL (ref 3.4–10.8)

## 2016-06-05 LAB — COMPREHENSIVE METABOLIC PANEL
ALBUMIN: 4.3 g/dL (ref 3.5–4.8)
ALK PHOS: 102 IU/L (ref 39–117)
ALT: 11 IU/L (ref 0–32)
AST: 18 IU/L (ref 0–40)
Albumin/Globulin Ratio: 1.2 (ref 1.2–2.2)
BUN / CREAT RATIO: 16 (ref 12–28)
BUN: 16 mg/dL (ref 8–27)
Bilirubin Total: 0.9 mg/dL (ref 0.0–1.2)
CO2: 26 mmol/L (ref 18–29)
CREATININE: 1.01 mg/dL — AB (ref 0.57–1.00)
Calcium: 9.6 mg/dL (ref 8.7–10.3)
Chloride: 96 mmol/L (ref 96–106)
GFR calc non Af Amer: 53 mL/min/{1.73_m2} — ABNORMAL LOW (ref >59)
GFR, EST AFRICAN AMERICAN: 61 mL/min/{1.73_m2} (ref >59)
GLOBULIN, TOTAL: 3.5 g/dL (ref 1.5–4.5)
Glucose: 152 mg/dL — ABNORMAL HIGH (ref 65–99)
Potassium: 3.8 mmol/L (ref 3.5–5.2)
SODIUM: 141 mmol/L (ref 134–144)
TOTAL PROTEIN: 7.8 g/dL (ref 6.0–8.5)

## 2016-06-05 LAB — LIPID PANEL
CHOLESTEROL TOTAL: 154 mg/dL (ref 100–199)
Chol/HDL Ratio: 2.9 ratio units (ref 0.0–4.4)
HDL: 53 mg/dL (ref >39)
LDL CALC: 85 mg/dL (ref 0–99)
Triglycerides: 78 mg/dL (ref 0–149)
VLDL CHOLESTEROL CAL: 16 mg/dL (ref 5–40)

## 2016-06-05 LAB — URINALYSIS, ROUTINE W REFLEX MICROSCOPIC
BILIRUBIN UA: NEGATIVE
Glucose, UA: NEGATIVE
Ketones, UA: NEGATIVE
LEUKOCYTES UA: NEGATIVE
Nitrite, UA: NEGATIVE
PH UA: 5.5 (ref 5.0–7.5)
PROTEIN UA: NEGATIVE
RBC UA: NEGATIVE
SPEC GRAV UA: 1.005 (ref 1.005–1.030)
Urobilinogen, Ur: 1 mg/dL (ref 0.2–1.0)

## 2016-06-05 LAB — TSH: TSH: 2.5 u[IU]/mL (ref 0.450–4.500)

## 2016-07-07 ENCOUNTER — Telehealth: Payer: Self-pay

## 2016-07-07 NOTE — Telephone Encounter (Signed)
Unclear from documentation, this needs to wait for Dr. Dossie Arbourrissman to address. Please call pt and/or pharmacy and let them know to keep things as is until he can address it

## 2016-07-07 NOTE — Telephone Encounter (Signed)
Pharmacy sent a fax regarding patient's potassium chloride prescription. It is written for 1 tablet daily but pharmacy states that the patient states that it is supposed to be 2 tablets daily. Pharmacy needs new prescription with correct directions. [facility]

## 2016-07-07 NOTE — Telephone Encounter (Signed)
Left message at the pharmacy that we are unable to determine until Dr.Crissman returns.

## 2016-07-07 NOTE — Telephone Encounter (Signed)
Left message for patient to call.

## 2016-12-01 ENCOUNTER — Ambulatory Visit (INDEPENDENT_AMBULATORY_CARE_PROVIDER_SITE_OTHER): Payer: Medicare Other | Admitting: Family Medicine

## 2016-12-01 ENCOUNTER — Telehealth: Payer: Self-pay | Admitting: Family Medicine

## 2016-12-01 ENCOUNTER — Encounter: Payer: Self-pay | Admitting: Family Medicine

## 2016-12-01 VITALS — BP 127/68 | HR 87 | Temp 98.7°F | Wt 167.0 lb

## 2016-12-01 DIAGNOSIS — I509 Heart failure, unspecified: Secondary | ICD-10-CM

## 2016-12-01 DIAGNOSIS — N183 Chronic kidney disease, stage 3 unspecified: Secondary | ICD-10-CM

## 2016-12-01 DIAGNOSIS — H6121 Impacted cerumen, right ear: Secondary | ICD-10-CM

## 2016-12-01 DIAGNOSIS — I480 Paroxysmal atrial fibrillation: Secondary | ICD-10-CM | POA: Diagnosis not present

## 2016-12-01 DIAGNOSIS — I130 Hypertensive heart and chronic kidney disease with heart failure and stage 1 through stage 4 chronic kidney disease, or unspecified chronic kidney disease: Secondary | ICD-10-CM

## 2016-12-01 DIAGNOSIS — F321 Major depressive disorder, single episode, moderate: Secondary | ICD-10-CM | POA: Insufficient documentation

## 2016-12-01 MED ORDER — RIVAROXABAN 15 MG PO TABS: 15.0000 mg | ORAL_TABLET | Freq: Every day | ORAL | 7 refills | Status: AC

## 2016-12-01 MED ORDER — CITALOPRAM HYDROBROMIDE 20 MG PO TABS
20.0000 mg | ORAL_TABLET | Freq: Every day | ORAL | 3 refills | Status: DC
Start: 2016-12-01 — End: 2016-12-15

## 2016-12-01 NOTE — Telephone Encounter (Signed)
See no mention in note. Please advise.

## 2016-12-01 NOTE — Telephone Encounter (Signed)
Phone call Discussed with patient and husband symptoms are mostly sneezing allergy symptoms discussed taking Allegra and we'll observe symptoms.

## 2016-12-01 NOTE — Assessment & Plan Note (Signed)
The current medical regimen is effective;  continue present plan and medications.  

## 2016-12-01 NOTE — Progress Notes (Signed)
BP 127/68   Pulse 87   Temp 98.7 F (37.1 C) (Oral)   Wt 167 lb (75.8 kg)   LMP  (LMP Unknown)   SpO2 93%   BMI 27.79 kg/m    Subjective:    Patient ID: Julie Tate, female    DOB: January 05, 1935, 82 y.o.   MRN: [id number]  HPI: Julie Tate is a 82 y.o. female  Chief Complaint  Patient presents with  . Follow-up  . Depression  Depression getting worse as had medicine for depression of the past took Zoloft which was not satisfactory wants to do something else. Depression score of 12 No real specific complaints otherwise doing well CHF cardiac symptoms blood thinner all seem to be doing well without any special issues breathing doing okay. Patient tired of just being inside and long days. CHF on stable taking medications without problems. Relevant past medical, surgical, family and social history reviewed and updated as indicated. Interim medical history since our last visit reviewed. Allergies and medications reviewed and updated.  Review of Systems  Constitutional: Negative.   Respiratory: Negative.   Cardiovascular: Negative.     Per HPI unless specifically indicated above     Objective:    BP 127/68   Pulse 87   Temp 98.7 F (37.1 C) (Oral)   Wt 167 lb (75.8 kg)   LMP  (LMP Unknown)   SpO2 93%   BMI 27.79 kg/m   Wt Readings from Last 3 Encounters:  12/01/16 167 lb (75.8 kg)  06/04/16 164 lb (74.4 kg)  02/04/16 161 lb (73 kg)    Physical Exam  Constitutional: She is oriented to person, place, and time. She appears well-developed and well-nourished.  HENT:  Head: Normocephalic and atraumatic.  Left ear blocked with cerumen removed with water and instruments revealing normal canal and TM right ear canal normal  Eyes: Conjunctivae and EOM are normal.  Neck: Normal range of motion.  Cardiovascular: Normal rate, regular rhythm and normal heart sounds.   Pulmonary/Chest: Effort normal and breath sounds normal.  Musculoskeletal: Normal range of motion.  She exhibits edema.  Neurological: She is alert and oriented to person, place, and time.  Skin: No erythema.  Psychiatric: She has a normal mood and affect. Her behavior is normal. Judgment and thought content normal.    Results for orders placed or performed in visit on 06/04/16  CBC with Differential/Platelet  Result Value Ref Range   WBC 7.1 3.4 - 10.8 x10E3/uL   RBC 5.13 3.77 - 5.28 x10E6/uL   Hemoglobin 14.5 11.1 - 15.9 g/dL   Hematocrit 40.944.1 81.134.0 - 46.6 %   MCV 86 79 - 97 fL   MCH 28.3 26.6 - 33.0 pg   MCHC 32.9 31.5 - 35.7 g/dL   RDW 91.416.4 (H) 78.212.3 - 95.615.4 %   Platelets 216 150 - 379 x10E3/uL   Neutrophils 74 Not Estab. %   Lymphs 12 Not Estab. %   Monocytes 11 Not Estab. %   Eos 3 Not Estab. %   Basos 0 Not Estab. %   Neutrophils Absolute 5.2 1.4 - 7.0 x10E3/uL   Lymphocytes Absolute 0.9 0.7 - 3.1 x10E3/uL   Monocytes Absolute 0.7 0.1 - 0.9 x10E3/uL   EOS (ABSOLUTE) 0.2 0.0 - 0.4 x10E3/uL   Basophils Absolute 0.0 0.0 - 0.2 x10E3/uL   Immature Granulocytes 0 Not Estab. %   Immature Grans (Abs) 0.0 0.0 - 0.1 x10E3/uL  Comprehensive metabolic panel  Result Value Ref Range  Glucose 152 (H) 65 - 99 mg/dL   BUN 16 8 - 27 mg/dL   Creatinine, Ser 2.951.01 (H) 0.57 - 1.00 mg/dL   GFR calc non Af Amer 53 (L) >59 mL/min/1.73   GFR calc Af Amer 61 >59 mL/min/1.73   BUN/Creatinine Ratio 16 12 - 28   Sodium 141 134 - 144 mmol/L   Potassium 3.8 3.5 - 5.2 mmol/L   Chloride 96 96 - 106 mmol/L   CO2 26 18 - 29 mmol/L   Calcium 9.6 8.7 - 10.3 mg/dL   Total Protein 7.8 6.0 - 8.5 g/dL   Albumin 4.3 3.5 - 4.8 g/dL   Globulin, Total 3.5 1.5 - 4.5 g/dL   Albumin/Globulin Ratio 1.2 1.2 - 2.2   Bilirubin Total 0.9 0.0 - 1.2 mg/dL   Alkaline Phosphatase 102 39 - 117 IU/L   AST 18 0 - 40 IU/L   ALT 11 0 - 32 IU/L  Lipid panel  Result Value Ref Range   Cholesterol, Total 154 100 - 199 mg/dL   Triglycerides 78 0 - 149 mg/dL   HDL 53 >62>39 mg/dL   VLDL Cholesterol Cal 16 5 - 40 mg/dL   LDL  Calculated 85 0 - 99 mg/dL   Chol/HDL Ratio 2.9 0.0 - 4.4 ratio units  TSH  Result Value Ref Range   TSH 2.500 0.450 - 4.500 uIU/mL  Urinalysis, Routine w reflex microscopic  Result Value Ref Range   Specific Gravity, UA 1.005 1.005 - 1.030   pH, UA 5.5 5.0 - 7.5   Color, UA Yellow Yellow   Appearance Ur Clear Clear   Leukocytes, UA Negative Negative   Protein, UA Negative Negative/Trace   Glucose, UA Negative Negative   Ketones, UA Negative Negative   RBC, UA Negative Negative   Bilirubin, UA Negative Negative   Urobilinogen, Ur 1.0 0.2 - 1.0 mg/dL   Nitrite, UA Negative Negative      Assessment & Plan:   Problem List Items Addressed This Visit      Cardiovascular and Mediastinum   Atrial fibrillation (HCC)    The current medical regimen is effective;  continue present plan and medications.       Relevant Medications   Rivaroxaban (XARELTO) 15 MG TABS tablet   CHF (congestive heart failure) (HCC)    The current medical regimen is effective;  continue present plan and medications.       Relevant Medications   Rivaroxaban (XARELTO) 15 MG TABS tablet   Hypertensive heart and renal disease   Relevant Medications   Rivaroxaban (XARELTO) 15 MG TABS tablet   Other Relevant Orders   Basic metabolic panel   CBC with Differential/Platelet     Genitourinary   CKD (chronic kidney disease), stage III - Primary   Relevant Orders   Basic metabolic panel   CBC with Differential/Platelet     Other   Depression, major, single episode, moderate (HCC)    Discussed depression care and treatment use of citalopram slow onset of action.      Relevant Medications   citalopram (CELEXA) 20 MG tablet    Other Visit Diagnoses    Hearing loss secondary to cerumen impaction, right           Follow up plan: Return in about 4 weeks (around 12/29/2016) for Recheck new start citalopram.

## 2016-12-01 NOTE — Assessment & Plan Note (Signed)
Discussed depression care and treatment use of citalopram slow onset of action.

## 2016-12-01 NOTE — Telephone Encounter (Signed)
Call pt  There was no discussion of any URI sx

## 2016-12-01 NOTE — Telephone Encounter (Signed)
Patient was transferred to provider for telephone conversation.   

## 2016-12-01 NOTE — Telephone Encounter (Signed)
Patient's husband would like to know if provider was going to prescribe antibiotic for patient's sinus infection.  Please Advise.  Thank you

## 2016-12-02 ENCOUNTER — Encounter: Payer: Self-pay | Admitting: Family Medicine

## 2016-12-02 LAB — CBC WITH DIFFERENTIAL/PLATELET
BASOS ABS: 0 10*3/uL (ref 0.0–0.2)
Basos: 0 %
EOS (ABSOLUTE): 0.2 10*3/uL (ref 0.0–0.4)
Eos: 3 %
Hematocrit: 45.4 % (ref 34.0–46.6)
Hemoglobin: 14.9 g/dL (ref 11.1–15.9)
IMMATURE GRANS (ABS): 0 10*3/uL (ref 0.0–0.1)
IMMATURE GRANULOCYTES: 0 %
Lymphocytes Absolute: 0.7 10*3/uL (ref 0.7–3.1)
Lymphs: 10 %
MCH: 29.3 pg (ref 26.6–33.0)
MCHC: 32.8 g/dL (ref 31.5–35.7)
MCV: 89 fL (ref 79–97)
Monocytes Absolute: 0.7 10*3/uL (ref 0.1–0.9)
Monocytes: 10 %
NEUTROS PCT: 77 %
Neutrophils Absolute: 5.2 10*3/uL (ref 1.4–7.0)
Platelets: 228 10*3/uL (ref 150–379)
RBC: 5.08 x10E6/uL (ref 3.77–5.28)
RDW: 16.7 % — AB (ref 12.3–15.4)
WBC: 6.8 10*3/uL (ref 3.4–10.8)

## 2016-12-02 LAB — BASIC METABOLIC PANEL
BUN/Creatinine Ratio: 18 (ref 12–28)
BUN: 19 mg/dL (ref 8–27)
CALCIUM: 10.1 mg/dL (ref 8.7–10.3)
CHLORIDE: 96 mmol/L (ref 96–106)
CO2: 28 mmol/L (ref 20–29)
Creatinine, Ser: 1.08 mg/dL — ABNORMAL HIGH (ref 0.57–1.00)
GFR calc Af Amer: 56 mL/min/{1.73_m2} — ABNORMAL LOW (ref >59)
GFR calc non Af Amer: 49 mL/min/{1.73_m2} — ABNORMAL LOW (ref >59)
GLUCOSE: 131 mg/dL — AB (ref 65–99)
POTASSIUM: 3.9 mmol/L (ref 3.5–5.2)
Sodium: 141 mmol/L (ref 134–144)

## 2016-12-11 ENCOUNTER — Telehealth: Payer: Self-pay | Admitting: Family Medicine

## 2016-12-11 NOTE — Telephone Encounter (Signed)
Patient would like an alternative medication for citalopram. Patient states medication is making her feel drowsy and gives her a dry, bitter taste in her mouth all day.  Please Advise.  Thank you

## 2016-12-11 NOTE — Telephone Encounter (Signed)
Other   Depression, major, single episode, moderate (HCC)    Discussed depression care and treatment use of citalopram slow onset of action.      Relevant Medications   citalopram (CELEXA) 20 MG tablet   Pt was started on citalopram on 12/01/16. Above is A&P from that date. Please advise.

## 2016-12-15 MED ORDER — FLUOXETINE HCL 20 MG PO TABS: 20.0000 mg | ORAL_TABLET | Freq: Every day | ORAL | 3 refills | Status: AC

## 2016-12-15 NOTE — Telephone Encounter (Signed)
Patient was transferred to provider for telephone conversation.   

## 2016-12-15 NOTE — Telephone Encounter (Signed)
Phone call Constipation with citalopram wants to stop will change to fluoxetine

## 2016-12-15 NOTE — Telephone Encounter (Signed)
Call pt 

## 2017-01-12 ENCOUNTER — Encounter: Payer: Self-pay | Admitting: Family Medicine

## 2017-01-12 ENCOUNTER — Ambulatory Visit (INDEPENDENT_AMBULATORY_CARE_PROVIDER_SITE_OTHER): Payer: Medicare Other | Admitting: Family Medicine

## 2017-01-12 VITALS — BP 146/72 | HR 72

## 2017-01-12 DIAGNOSIS — F321 Major depressive disorder, single episode, moderate: Secondary | ICD-10-CM

## 2017-01-12 DIAGNOSIS — I130 Hypertensive heart and chronic kidney disease with heart failure and stage 1 through stage 4 chronic kidney disease, or unspecified chronic kidney disease: Secondary | ICD-10-CM | POA: Diagnosis not present

## 2017-01-12 DIAGNOSIS — Z23 Encounter for immunization: Secondary | ICD-10-CM | POA: Diagnosis not present

## 2017-01-12 MED ORDER — VENLAFAXINE HCL ER 37.5 MG PO CP24: 37.5000 mg | ORAL_CAPSULE | Freq: Every day | ORAL | 0 refills | Status: AC

## 2017-01-12 MED ORDER — VENLAFAXINE HCL ER 75 MG PO CP24: 75.0000 mg | ORAL_CAPSULE | Freq: Every day | ORAL | 2 refills | Status: AC

## 2017-01-12 NOTE — Assessment & Plan Note (Signed)
Ongoing depression with no response to SSRI medications Will stop and start Effexor

## 2017-01-12 NOTE — Patient Instructions (Signed)

## 2017-01-12 NOTE — Assessment & Plan Note (Signed)
The current medical regimen is effective;  continue present plan and medications.  

## 2017-01-12 NOTE — Progress Notes (Signed)
BP (!) 146/72   Pulse 72   LMP  (LMP Unknown)   SpO2 95%    Subjective:    Patient ID: Floyde Parkins, female    DOB: 01-Nov-1934, 82 y.o.   MRN: [id number]  HPI: LESHA JAGER is a 82 y.o. female  Chief Complaint  Patient presents with  . Follow-up  Patient follow-up accompanied by her husband who assists with history some patient was on Zoloft and knots satisfactory response wanting something else patient was tried on Celexa and then followed up with Prozac again with not satisfactory and still depressed. Still having depression symptoms. Blood pressure stable  Heart stable no overt CHF symptoms.  Relevant past medical, surgical, family and social history reviewed and updated as indicated. Interim medical history since our last visit reviewed. Allergies and medications reviewed and updated.  Review of Systems  Constitutional: Negative.   Respiratory: Negative.   Cardiovascular: Negative.     Per HPI unless specifically indicated above     Objective:    BP (!) 146/72   Pulse 72   LMP  (LMP Unknown)   SpO2 95%   Wt Readings from Last 3 Encounters:  12/01/16 167 lb (75.8 kg)  06/04/16 164 lb (74.4 kg)  02/04/16 161 lb (73 kg)    Physical Exam  Constitutional: She is oriented to person, place, and time. She appears well-developed and well-nourished.  HENT:  Head: Normocephalic and atraumatic.  Eyes: Conjunctivae and EOM are normal.  Neck: Normal range of motion.  Cardiovascular: Normal rate, regular rhythm and normal heart sounds.   Pulmonary/Chest: Effort normal and breath sounds normal.  Musculoskeletal: Normal range of motion.  Neurological: She is alert and oriented to person, place, and time.  Skin: No erythema.  Psychiatric: She has a normal mood and affect. Her behavior is normal. Judgment and thought content normal.    Results for orders placed or performed in visit on 12/01/16  Basic metabolic panel  Result Value Ref Range   Glucose 131 (H) 65  - 99 mg/dL   BUN 19 8 - 27 mg/dL   Creatinine, Ser 4.09 (H) 0.57 - 1.00 mg/dL   GFR calc non Af Amer 49 (L) >59 mL/min/1.73   GFR calc Af Amer 56 (L) >59 mL/min/1.73   BUN/Creatinine Ratio 18 12 - 28   Sodium 141 134 - 144 mmol/L   Potassium 3.9 3.5 - 5.2 mmol/L   Chloride 96 96 - 106 mmol/L   CO2 28 20 - 29 mmol/L   Calcium 10.1 8.7 - 10.3 mg/dL  CBC with Differential/Platelet  Result Value Ref Range   WBC 6.8 3.4 - 10.8 x10E3/uL   RBC 5.08 3.77 - 5.28 x10E6/uL   Hemoglobin 14.9 11.1 - 15.9 g/dL   Hematocrit 81.1 91.4 - 46.6 %   MCV 89 79 - 97 fL   MCH 29.3 26.6 - 33.0 pg   MCHC 32.8 31.5 - 35.7 g/dL   RDW 78.2 (H) 95.6 - 21.3 %   Platelets 228 150 - 379 x10E3/uL   Neutrophils 77 Not Estab. %   Lymphs 10 Not Estab. %   Monocytes 10 Not Estab. %   Eos 3 Not Estab. %   Basos 0 Not Estab. %   Neutrophils Absolute 5.2 1.4 - 7.0 x10E3/uL   Lymphocytes Absolute 0.7 0.7 - 3.1 x10E3/uL   Monocytes Absolute 0.7 0.1 - 0.9 x10E3/uL   EOS (ABSOLUTE) 0.2 0.0 - 0.4 x10E3/uL   Basophils Absolute 0.0 0.0 - 0.2  x10E3/uL   Immature Granulocytes 0 Not Estab. %   Immature Grans (Abs) 0.0 0.0 - 0.1 x10E3/uL      Assessment & Plan:   Problem List Items Addressed This Visit      Cardiovascular and Mediastinum   Hypertensive heart and renal disease    The current medical regimen is effective;  continue present plan and medications.         Other   Depression, major, single episode, moderate (HCC)    Ongoing depression with no response to SSRI medications Will stop and start Effexor      Relevant Medications   venlafaxine XR (EFFEXOR-XR) 37.5 MG 24 hr capsule   venlafaxine XR (EFFEXOR-XR) 75 MG 24 hr capsule    Other Visit Diagnoses    Needs flu shot    -  Primary   Relevant Orders   Flu vaccine HIGH DOSE PF (Completed)       Follow up plan: Return in about 4 weeks (around 02/09/2017).

## 2017-01-18 ENCOUNTER — Emergency Department: Payer: Medicare Other

## 2017-01-18 ENCOUNTER — Inpatient Hospital Stay
Admission: EM | Admit: 2017-01-18 | Discharge: 2017-02-02 | DRG: 291 | Disposition: E | Payer: Medicare Other | Attending: Specialist | Admitting: Specialist

## 2017-01-18 ENCOUNTER — Encounter: Payer: Self-pay | Admitting: Emergency Medicine

## 2017-01-18 DIAGNOSIS — J90 Pleural effusion, not elsewhere classified: Secondary | ICD-10-CM

## 2017-01-18 DIAGNOSIS — J9601 Acute respiratory failure with hypoxia: Secondary | ICD-10-CM | POA: Diagnosis not present

## 2017-01-18 DIAGNOSIS — F329 Major depressive disorder, single episode, unspecified: Secondary | ICD-10-CM | POA: Diagnosis present

## 2017-01-18 DIAGNOSIS — R739 Hyperglycemia, unspecified: Secondary | ICD-10-CM | POA: Diagnosis present

## 2017-01-18 DIAGNOSIS — J9622 Acute and chronic respiratory failure with hypercapnia: Secondary | ICD-10-CM | POA: Diagnosis present

## 2017-01-18 DIAGNOSIS — Z79899 Other long term (current) drug therapy: Secondary | ICD-10-CM | POA: Diagnosis not present

## 2017-01-18 DIAGNOSIS — I5031 Acute diastolic (congestive) heart failure: Secondary | ICD-10-CM | POA: Diagnosis present

## 2017-01-18 DIAGNOSIS — R000 Tachycardia, unspecified: Secondary | ICD-10-CM | POA: Diagnosis present

## 2017-01-18 DIAGNOSIS — D869 Sarcoidosis, unspecified: Secondary | ICD-10-CM | POA: Diagnosis present

## 2017-01-18 DIAGNOSIS — J810 Acute pulmonary edema: Secondary | ICD-10-CM | POA: Diagnosis not present

## 2017-01-18 DIAGNOSIS — I482 Chronic atrial fibrillation: Secondary | ICD-10-CM | POA: Diagnosis present

## 2017-01-18 DIAGNOSIS — J9600 Acute respiratory failure, unspecified whether with hypoxia or hypercapnia: Secondary | ICD-10-CM

## 2017-01-18 DIAGNOSIS — J9621 Acute and chronic respiratory failure with hypoxia: Secondary | ICD-10-CM | POA: Diagnosis present

## 2017-01-18 DIAGNOSIS — M810 Age-related osteoporosis without current pathological fracture: Secondary | ICD-10-CM | POA: Diagnosis present

## 2017-01-18 DIAGNOSIS — J441 Chronic obstructive pulmonary disease with (acute) exacerbation: Secondary | ICD-10-CM | POA: Diagnosis present

## 2017-01-18 DIAGNOSIS — I959 Hypotension, unspecified: Secondary | ICD-10-CM | POA: Diagnosis present

## 2017-01-18 DIAGNOSIS — Z515 Encounter for palliative care: Secondary | ICD-10-CM | POA: Diagnosis present

## 2017-01-18 DIAGNOSIS — Z90710 Acquired absence of both cervix and uterus: Secondary | ICD-10-CM

## 2017-01-18 DIAGNOSIS — T502X5A Adverse effect of carbonic-anhydrase inhibitors, benzothiadiazides and other diuretics, initial encounter: Secondary | ICD-10-CM | POA: Diagnosis present

## 2017-01-18 DIAGNOSIS — Z888 Allergy status to other drugs, medicaments and biological substances status: Secondary | ICD-10-CM | POA: Diagnosis not present

## 2017-01-18 DIAGNOSIS — Z7189 Other specified counseling: Secondary | ICD-10-CM | POA: Diagnosis not present

## 2017-01-18 DIAGNOSIS — I130 Hypertensive heart and chronic kidney disease with heart failure and stage 1 through stage 4 chronic kidney disease, or unspecified chronic kidney disease: Secondary | ICD-10-CM | POA: Diagnosis present

## 2017-01-18 DIAGNOSIS — J9602 Acute respiratory failure with hypercapnia: Secondary | ICD-10-CM | POA: Diagnosis not present

## 2017-01-18 DIAGNOSIS — N183 Chronic kidney disease, stage 3 (moderate): Secondary | ICD-10-CM | POA: Diagnosis present

## 2017-01-18 DIAGNOSIS — I340 Nonrheumatic mitral (valve) insufficiency: Secondary | ICD-10-CM | POA: Diagnosis not present

## 2017-01-18 DIAGNOSIS — Z66 Do not resuscitate: Secondary | ICD-10-CM | POA: Diagnosis not present

## 2017-01-18 DIAGNOSIS — I248 Other forms of acute ischemic heart disease: Secondary | ICD-10-CM | POA: Diagnosis present

## 2017-01-18 DIAGNOSIS — Z8249 Family history of ischemic heart disease and other diseases of the circulatory system: Secondary | ICD-10-CM

## 2017-01-18 DIAGNOSIS — J9690 Respiratory failure, unspecified, unspecified whether with hypoxia or hypercapnia: Secondary | ICD-10-CM

## 2017-01-18 DIAGNOSIS — Z800 Family history of malignant neoplasm of digestive organs: Secondary | ICD-10-CM | POA: Diagnosis not present

## 2017-01-18 DIAGNOSIS — I272 Pulmonary hypertension, unspecified: Secondary | ICD-10-CM | POA: Diagnosis not present

## 2017-01-18 DIAGNOSIS — N179 Acute kidney failure, unspecified: Secondary | ICD-10-CM | POA: Diagnosis not present

## 2017-01-18 DIAGNOSIS — I5020 Unspecified systolic (congestive) heart failure: Secondary | ICD-10-CM

## 2017-01-18 DIAGNOSIS — I5033 Acute on chronic diastolic (congestive) heart failure: Secondary | ICD-10-CM | POA: Diagnosis present

## 2017-01-18 DIAGNOSIS — R54 Age-related physical debility: Secondary | ICD-10-CM | POA: Diagnosis present

## 2017-01-18 DIAGNOSIS — Z808 Family history of malignant neoplasm of other organs or systems: Secondary | ICD-10-CM

## 2017-01-18 DIAGNOSIS — I509 Heart failure, unspecified: Secondary | ICD-10-CM | POA: Diagnosis not present

## 2017-01-18 DIAGNOSIS — Z7901 Long term (current) use of anticoagulants: Secondary | ICD-10-CM

## 2017-01-18 DIAGNOSIS — I5023 Acute on chronic systolic (congestive) heart failure: Secondary | ICD-10-CM | POA: Diagnosis not present

## 2017-01-18 DIAGNOSIS — I2721 Secondary pulmonary arterial hypertension: Secondary | ICD-10-CM | POA: Diagnosis present

## 2017-01-18 HISTORY — DX: Chronic atrial fibrillation, unspecified: I48.20

## 2017-01-18 HISTORY — DX: Chronic systolic (congestive) heart failure: I50.22

## 2017-01-18 LAB — CBC WITH DIFFERENTIAL/PLATELET
Basophils Absolute: 0.1 10*3/uL (ref 0–0.1)
Basophils Relative: 1 %
EOS ABS: 0 10*3/uL (ref 0–0.7)
Eosinophils Relative: 1 %
HEMATOCRIT: 45.6 % (ref 35.0–47.0)
HEMOGLOBIN: 15.3 g/dL (ref 12.0–16.0)
LYMPHS ABS: 0.5 10*3/uL — AB (ref 1.0–3.6)
LYMPHS PCT: 5 %
MCH: 30 pg (ref 26.0–34.0)
MCHC: 33.5 g/dL (ref 32.0–36.0)
MCV: 89.7 fL (ref 80.0–100.0)
MONOS PCT: 8 %
Monocytes Absolute: 0.8 10*3/uL (ref 0.2–0.9)
NEUTROS ABS: 8.3 10*3/uL — AB (ref 1.4–6.5)
NEUTROS PCT: 85 %
Platelets: 186 10*3/uL (ref 150–440)
RBC: 5.08 MIL/uL (ref 3.80–5.20)
RDW: 17 % — ABNORMAL HIGH (ref 11.5–14.5)
WBC: 9.6 10*3/uL (ref 3.6–11.0)

## 2017-01-18 LAB — COMPREHENSIVE METABOLIC PANEL
ALT: 17 U/L (ref 14–54)
ANION GAP: 11 (ref 5–15)
AST: 27 U/L (ref 15–41)
Albumin: 4.1 g/dL (ref 3.5–5.0)
Alkaline Phosphatase: 88 U/L (ref 38–126)
BUN: 28 mg/dL — AB (ref 6–20)
CHLORIDE: 100 mmol/L — AB (ref 101–111)
CO2: 28 mmol/L (ref 22–32)
Calcium: 9.4 mg/dL (ref 8.9–10.3)
Creatinine, Ser: 0.94 mg/dL (ref 0.44–1.00)
GFR, EST NON AFRICAN AMERICAN: 56 mL/min — AB (ref >60)
GLUCOSE: 125 mg/dL — AB (ref 65–99)
POTASSIUM: 4.6 mmol/L (ref 3.5–5.1)
Sodium: 139 mmol/L (ref 135–145)
TOTAL PROTEIN: 7.9 g/dL (ref 6.5–8.1)
Total Bilirubin: 1.1 mg/dL (ref 0.3–1.2)

## 2017-01-18 LAB — TROPONIN I
Troponin I: <0.03 ng/mL (ref <0.03)
Troponin I: <0.03 ng/mL (ref <0.03)

## 2017-01-18 LAB — BRAIN NATRIURETIC PEPTIDE: B Natriuretic Peptide: 395 pg/mL — ABNORMAL HIGH (ref 0.0–100.0)

## 2017-01-18 MED ORDER — TIOTROPIUM BROMIDE MONOHYDRATE 18 MCG IN CAPS
18.0000 ug | ORAL_CAPSULE | Freq: Every day | RESPIRATORY_TRACT | Status: DC
  Filled 2017-01-18: qty 5

## 2017-01-18 MED ORDER — HEPARIN SODIUM (PORCINE) 5000 UNIT/ML IJ SOLN: 5000.0000 [IU] | Freq: Three times a day (TID) | INTRAMUSCULAR | Status: DC

## 2017-01-18 MED ORDER — CITALOPRAM HYDROBROMIDE 20 MG PO TABS: 20.0000 mg | ORAL_TABLET | Freq: Every day | ORAL | Status: DC

## 2017-01-18 MED ORDER — ENALAPRILAT 1.25 MG/ML IV SOLN
0.6250 mg | Freq: Once | INTRAVENOUS | Status: AC
  Administered 2017-01-18: 0.625 mg via INTRAVENOUS
  Filled 2017-01-18: qty 2

## 2017-01-18 MED ORDER — CARVEDILOL 25 MG PO TABS
25.0000 mg | ORAL_TABLET | Freq: Two times a day (BID) | ORAL | Status: DC
  Administered 2017-01-18: 25 mg via ORAL
  Filled 2017-01-18: qty 1

## 2017-01-18 MED ORDER — DILTIAZEM HCL ER COATED BEADS 240 MG PO CP24
240.0000 mg | ORAL_CAPSULE | Freq: Every evening | ORAL | Status: DC
  Administered 2017-01-18: 240 mg via ORAL
  Filled 2017-01-18: qty 2
  Filled 2017-01-18: qty 1
  Filled 2017-01-18: qty 2

## 2017-01-18 MED ORDER — POTASSIUM CHLORIDE CRYS ER 20 MEQ PO TBCR
20.0000 meq | EXTENDED_RELEASE_TABLET | Freq: Every day | ORAL | Status: DC
  Administered 2017-01-18: 20 meq via ORAL
  Filled 2017-01-18: qty 1

## 2017-01-18 MED ORDER — NITROGLYCERIN 2 % TD OINT
0.5000 [in_us] | TOPICAL_OINTMENT | Freq: Once | TRANSDERMAL | Status: AC
  Administered 2017-01-18: 0.5 [in_us] via TOPICAL
  Filled 2017-01-18: qty 1

## 2017-01-18 MED ORDER — IPRATROPIUM-ALBUTEROL 0.5-2.5 (3) MG/3ML IN SOLN
RESPIRATORY_TRACT | Status: AC
  Filled 2017-01-18: qty 3

## 2017-01-18 MED ORDER — DOCUSATE SODIUM 100 MG PO CAPS: 100.0000 mg | ORAL_CAPSULE | Freq: Two times a day (BID) | ORAL | Status: DC | PRN

## 2017-01-18 MED ORDER — IPRATROPIUM-ALBUTEROL 0.5-2.5 (3) MG/3ML IN SOLN
3.0000 mL | RESPIRATORY_TRACT | Status: DC | PRN
  Administered 2017-01-18: 3 mL via RESPIRATORY_TRACT

## 2017-01-18 MED ORDER — FUROSEMIDE 10 MG/ML IJ SOLN
20.0000 mg | Freq: Three times a day (TID) | INTRAMUSCULAR | Status: DC
  Administered 2017-01-18 – 2017-01-20 (×6): 20 mg via INTRAVENOUS
  Filled 2017-01-18 (×6): qty 2

## 2017-01-18 MED ORDER — RIVAROXABAN 15 MG PO TABS
15.0000 mg | ORAL_TABLET | Freq: Every day | ORAL | Status: DC
  Administered 2017-01-18: 15 mg via ORAL
  Filled 2017-01-18 (×2): qty 1

## 2017-01-18 MED ORDER — FUROSEMIDE 10 MG/ML IJ SOLN
20.0000 mg | Freq: Once | INTRAMUSCULAR | Status: AC
  Administered 2017-01-18: 20 mg via INTRAVENOUS
  Filled 2017-01-18: qty 4

## 2017-01-18 NOTE — ED Triage Notes (Signed)
Pt to ED via EMS from home with c/o SOB xfew days, hx of CHF. Pt was 85% on 6L [place], pt placed on non rebreather at this time and MD at bedside. Pt A&Ox4

## 2017-01-18 NOTE — Progress Notes (Signed)
Pt home meds were returned to go home with husband Mr Marchese

## 2017-01-18 NOTE — Discharge Instructions (Signed)
Heart Failure Clinic appointment on January 27 2017 at 11:00am with Clarisa Kindred, FNP. Please call [phone] to reschedule.

## 2017-01-18 NOTE — ED Provider Notes (Signed)
[facility] Emergency Department Provider Note   ____________________________________________   First MD Initiated Contact with Patient 01/27/2017 1100     (approximate)  I have reviewed the triage vital signs and the nursing notes.   HISTORY  Chief Complaint Shortness of Breath   HPI Julie Tate is a 82 y.o. female who reports onset of shortness of breath on Thursday. Today is Sunday. His been gradually getting worse. She has not had a fever. She does not have any chest pain belly pain tightness or any other complaints. She says her legs are swelling little bit more than usual.in the emergency room she is trying oxygen saturations of 88% on 6 L.   Past Medical History:  Diagnosis Date  . Adrenal mass (HCC)   . Asthma   . Atrial fibrillation (HCC)   . CHF (congestive heart failure) (HCC)   . CKD (chronic kidney disease), stage III   . Hypertension   . Osteopenia   . Proteinuria   . Sarcoidosis   . Vitamin D deficiency     Patient Active Problem List   Diagnosis Date Noted  . Depression, major, single episode, moderate (HCC) 12/01/2016  . Leg edema, left 05/31/2015  . Proteinuria 11/15/2014  . Benign hypertensive renal disease 11/15/2014  . Asthma 11/15/2014  . Osteopenia 11/15/2014  . Atrial fibrillation (HCC) 11/15/2014  . CHF (congestive heart failure) (HCC) 11/15/2014  . CKD (chronic kidney disease), stage III 11/15/2014  . Hypertensive heart and renal disease 11/15/2014    Past Surgical History:  Procedure Laterality Date  . ABDOMINAL HYSTERECTOMY    . EYE SURGERY     cataract    Prior to Admission medications   Medication Sig Start Date End Date Taking? Authorizing Provider  carvedilol (COREG) 25 MG tablet Take 1 tablet (25 mg total) by mouth 2 (two) times daily with a meal. 06/04/16   Crissman, Redge Gainer, MD  diltiazem (CARDIZEM CD) 240 MG 24 hr capsule Take 1 capsule (240 mg total) by mouth daily. 06/04/16   Steele Sizer,  MD  FLUoxetine (PROZAC) 20 MG tablet Take 1 tablet (20 mg total) by mouth daily. Patient not taking: Reported on 01/12/2017 12/15/16   Steele Sizer, MD  furosemide (LASIX) 20 MG tablet Take 1 tablet (20 mg total) by mouth 2 (two) times daily. 06/04/16   Steele Sizer, MD  Loratadine (CLARITIN PO) Take by mouth daily as needed.    [provider]  potassium chloride (KLOR-CON) 8 MEQ tablet Take 1 tablet (8 mEq total) by mouth daily. 06/04/16   Steele Sizer, MD  Rivaroxaban (XARELTO) 15 MG TABS tablet Take 1 tablet (15 mg total) by mouth daily. 12/01/16   Steele Sizer, MD  tiotropium (SPIRIVA HANDIHALER) 18 MCG inhalation capsule Place 1 capsule (18 mcg total) into inhaler and inhale daily. 06/04/16   Steele Sizer, MD  venlafaxine XR (EFFEXOR-XR) 37.5 MG 24 hr capsule Take 1 capsule (37.5 mg total) by mouth daily with breakfast. 01/12/17   Steele Sizer, MD  venlafaxine XR (EFFEXOR-XR) 75 MG 24 hr capsule Take 1 capsule (75 mg total) by mouth daily with breakfast. 01/12/17   Steele Sizer, MD    Allergies Entex lq [phenylephrine-guaifenesin] and Norvasc [amlodipine besylate]  Family History  Problem Relation Age of Onset  . Cancer Mother        colon  . Cancer Father        bone  . Heart disease  Paternal Grandfather     Social History Social History  Substance Use Topics  . Smoking status: Never Smoker  . Smokeless tobacco: Never Used  . Alcohol use No    Review of Systems  Constitutional: No fever/chills Eyes: No visual changes. ENT: No sore throat. Cardiovascular: Denies chest pain. Respiratory:shortness of breath. Gastrointestinal: No abdominal pain.  No nausea, no vomiting.  No diarrhea.  No constipation. Genitourinary: Negative for dysuria. Musculoskeletal: Negative for back pain. Skin: Negative for rash. Neurological: Negative for headaches, focal weakness  ____________________________________________   PHYSICAL EXAM:  VITAL SIGNS: ED  Triage Vitals  Enc Vitals Group     BP 01/07/2017 1056 (!) 153/88     Pulse Rate 01/23/2017 1056 80     Resp 01/13/2017 1056 (!) 24     Temp 01/05/2017 1121 (!) 96.9 F (36.1 C)     Temp Source 01/17/2017 1121 Rectal     SpO2 01/26/2017 1056 (!) 85 %     Weight --      Height --      Head Circumference --      Peak Flow --      Pain Score 01/22/2017 1056 0     Pain Loc --      Pain Edu? --      Excl. in GC? --     Constitutional: Alert and oriented. Well appearing and in no acute distress!! Eyes: Conjunctivae are normal.  Head: Atraumatic. Nose: No congestion/rhinnorhea. Mouth/Throat: Mucous membranes are moist.  Oropharynx non-erythematous. Neck: No stridor. Patient does have JVD to the angle Cardiovascular: Normal rate, regular rhythm. Grossly normal heart sounds.  Good peripheral circulation. Respiratory: Normal respiratory effort.  No retractions. Lungs Diffuse crackles throughout Gastrointestinal: Soft and nontender. No distention. No abdominal bruits. No CVA tenderness. Musculoskeletal: No lower extremity tenderness marked edemaleft greater than right.  No joint effusions. Neurologic:  Normal speech and language. No gross focal neurologic deficits are appreciated. No gait instability. Skin:  Skin is warm, dry and intact. No rash noted. Psychiatric: Mood and affect are normal. Speech and behavior are normal.  ____________________________________________   LABS (all labs ordered are listed, but only abnormal results are displayed)  Labs Reviewed  COMPREHENSIVE METABOLIC PANEL - Abnormal; Notable for the following:       Result Value   Chloride 100 (*)    Glucose, Bld 125 (*)    BUN 28 (*)    GFR calc non Af Amer 56 (*)    All other components within normal limits  BRAIN NATRIURETIC PEPTIDE - Abnormal; Notable for the following:    B Natriuretic Peptide 395.0 (*)    All other components within normal limits  CBC WITH DIFFERENTIAL/PLATELET - Abnormal; Notable for the following:      RDW 17.0 (*)    Neutro Abs 8.3 (*)    Lymphs Abs 0.5 (*)    All other components within normal limits  TROPONIN I   ____________________________________________  EKG  EKG read and interpreted by me shows A. fib at a rate of 84 normal axis Q's in 1 and L no acute ST-T wave changes____________________________________________  RADIOLOGY  Dg Chest Portable 1 View  Result Date: 01/15/2017 CLINICAL DATA:  Shortness of breath. History CHF. kidney disease. Hypertension. EXAM: PORTABLE CHEST 1 VIEW COMPARISON:  01/02/2014 FINDINGS: Osteopenia. Midline trachea. Cardiomegaly accentuated by AP portable technique. Atherosclerosis in the transverse aorta. Small, right larger than left pleural effusions. No pneumothorax. Moderate interstitial edema. Right worse than left base airspace  disease. IMPRESSION: Moderate congestive heart failure. Right greater than left pleural effusions with adjacent airspace disease. This is most likely atelectasis. Concurrent infection or aspiration cannot be excluded. Aortic Atherosclerosis (ICD10-I70.0). Electronically Signed   By: Jeronimo Greaves M.D.   On: 01/20/2017 11:22    ____________________________________________   PROCEDURES  Procedure(s) performed:   Procedures  Critical Care performed:  ____________________________________________   INITIAL IMPRESSION / ASSESSMENT AND PLAN / ED COURSE  Pertinent labs & imaging results that were available during my care of the patient were reviewed by me and considered in my medical decision making (see chart for details).        ____________________________________________   FINAL CLINICAL IMPRESSION(S) / ED DIAGNOSES  Final diagnoses:  Systolic congestive heart failure, unspecified HF chronicity (HCC)      NEW MEDICATIONS STARTED DURING THIS VISIT:  New Prescriptions   No medications on file     Note:  This document was prepared using Dragon voice recognition software and may include  unintentional dictation errors.    Arnaldo Natal, MD 01/30/2017 1210

## 2017-01-18 NOTE — ED Notes (Signed)
EDP notified of pt's temp, pt provided with warm blankets.  No changes to POC at this time.

## 2017-01-18 NOTE — ED Notes (Signed)
Per Dr. Elisabeth Pigeon, pt taken off bipap and placed on 4LNC to see how pt tolerates.  Will continue to monitor.

## 2017-01-18 NOTE — Progress Notes (Signed)
Patient wanted off bipap. Stated too much air. Can no longer tolerate. o2 saturation in 80's on 6liter nasal cannula. Placed patient on HFNC 60%40L. Saturation noted at 93. Patient tolerated well. Will continue to monitor

## 2017-01-18 NOTE — Progress Notes (Signed)
Patient with saturation 90 on 6liters.  Faint expiratory wheezes noted. Patient reports"feels sob".  Patient was weaned off bipap earlier today.. Received order for prn tx and bipap at bedtime. Patient tolerated all well. Saturation on bipap increased to 94%. Patient reports breathing to be better with interventions

## 2017-01-18 NOTE — H&P (Signed)
[employer] - [place] at [facility]   PATIENT NAME: Julie Tate    MR#:  [id number]  DATE OF BIRTH:  [birth year]  DATE OF ADMISSION:  01/17/2017  PRIMARY CARE PHYSICIAN: Steele Sizer, MD   REQUESTING/REFERRING PHYSICIAN: malinda   CHIEF COMPLAINT:   Chief Complaint  Patient presents with  . Shortness of Breath    HISTORY OF PRESENT ILLNESS: Julie Tate  is a 82 y.o. female with a known history of Asthma, CHF, CKD- stage 3, Htn, Sarcoidosis, Adrenal mass, A fib- have Chronic leg edema and could not sleep flat in bed for last many months to year. Was worse SOB today- so called EMS. Noted Hypoxia and tachycardia with Edema on Xray chest. Started on bipap and after improvement on nasal canula in ER.  PAST MEDICAL HISTORY:   Past Medical History:  Diagnosis Date  . Adrenal mass (HCC)   . Asthma   . Atrial fibrillation (HCC)   . CHF (congestive heart failure) (HCC)   . CKD (chronic kidney disease), stage III   . Hypertension   . Osteopenia   . Proteinuria   . Sarcoidosis   . Vitamin D deficiency     PAST SURGICAL HISTORY: Past Surgical History:  Procedure Laterality Date  . ABDOMINAL HYSTERECTOMY    . EYE SURGERY     cataract    SOCIAL HISTORY:  Social History  Substance Use Topics  . Smoking status: Never Smoker  . Smokeless tobacco: Never Used  . Alcohol use No    FAMILY HISTORY:  Family History  Problem Relation Age of Onset  . Cancer Mother        colon  . Cancer Father        bone  . Heart disease Paternal Grandfather     DRUG ALLERGIES:  Allergies  Allergen Reactions  . Entex Lq [Phenylephrine-Guaifenesin]   . Norvasc [Amlodipine Besylate]     REVIEW OF SYSTEMS:   CONSTITUTIONAL: No fever, fatigue or weakness.  EYES: No blurred or double vision.  EARS, NOSE, AND THROAT: No tinnitus or ear pain.  RESPIRATORY: No cough,positive for shortness of breath,no wheezing or hemoptysis.  CARDIOVASCULAR: No chest pain, have  orthopnea, edema.  GASTROINTESTINAL: No nausea, vomiting, diarrhea or abdominal pain.  GENITOURINARY: No dysuria, hematuria.  ENDOCRINE: No polyuria, nocturia,  HEMATOLOGY: No anemia, easy bruising or bleeding SKIN: No rash or lesion. MUSCULOSKELETAL: No joint pain or arthritis.   NEUROLOGIC: No tingling, numbness, weakness.  PSYCHIATRY: No anxiety or depression.   MEDICATIONS AT HOME:  Prior to Admission medications   Medication Sig Start Date End Date Taking? Authorizing Provider  carvedilol (COREG) 25 MG tablet Take 1 tablet (25 mg total) by mouth 2 (two) times daily with a meal. 06/04/16  Yes Crissman, Redge Gainer, MD  diltiazem (CARDIZEM CD) 240 MG 24 hr capsule Take 1 capsule (240 mg total) by mouth daily. 06/04/16  Yes Crissman, Redge Gainer, MD  furosemide (LASIX) 20 MG tablet Take 1 tablet (20 mg total) by mouth 2 (two) times daily. 06/04/16  Yes Crissman, Redge Gainer, MD  Loratadine (CLARITIN PO) Take by mouth daily as needed.   Yes [provider]  potassium chloride (KLOR-CON) 8 MEQ tablet Take 1 tablet (8 mEq total) by mouth daily. 06/04/16  Yes Crissman, Redge Gainer, MD  Rivaroxaban (XARELTO) 15 MG TABS tablet Take 1 tablet (15 mg total) by mouth daily. 12/01/16  Yes Steele Sizer, MD  tiotropium (SPIRIVA HANDIHALER) 18 MCG inhalation capsule Place  1 capsule (18 mcg total) into inhaler and inhale daily. 06/04/16  Yes Crissman, Redge Gainer, MD  triamcinolone (NASACORT ALLERGY 24HR) 55 MCG/ACT AERO nasal inhaler Place 2 sprays into the nose daily.   Yes [provider]  citalopram (CELEXA) 20 MG tablet Take 1 tablet by mouth daily. 12/01/16   [provider]  FLUoxetine (PROZAC) 20 MG tablet Take 1 tablet (20 mg total) by mouth daily. Patient not taking: Reported on 01/12/2017 12/15/16   Steele Sizer, MD  venlafaxine XR (EFFEXOR-XR) 37.5 MG 24 hr capsule Take 1 capsule (37.5 mg total) by mouth daily with breakfast. Patient not taking: Reported on 01-21-17 01/12/17   Steele Sizer, MD  venlafaxine XR (EFFEXOR-XR) 75 MG 24 hr capsule Take 1 capsule (75 mg total) by mouth daily with breakfast. Patient not taking: Reported on 01/21/2017 01/12/17   Steele Sizer, MD      PHYSICAL EXAMINATION:   VITAL SIGNS: Blood pressure 129/89, pulse 80, temperature (!) 96.9 F (36.1 C), temperature source Rectal, resp. rate (!) 21, SpO2 96 %.  GENERAL:  82 y.o.-year-old patient lying in the bed with no acute distress.  EYES: Pupils equal, round, reactive to light and accommodation. No scleral icterus. Extraocular muscles intact.  HEENT: Head atraumatic, normocephalic. Oropharynx and nasopharynx clear.  NECK:  Supple, no jugular venous distention. No thyroid enlargement, no tenderness.  LUNGS: Normal breath sounds bilaterally, no wheezing, have bilateral crepitation. No use of accessory muscles of respiration.  CARDIOVASCULAR: S1, S2 normal. No murmurs, rubs, or gallops.  ABDOMEN: Soft, nontender, nondistended. Bowel sounds present. No organomegaly or mass.  EXTREMITIES: b/l extensive pedal edema,no cyanosis, or clubbing.  NEUROLOGIC: Cranial nerves II through XII are intact. Muscle strength 5/5 in all extremities. Sensation intact. Gait not checked.  PSYCHIATRIC: The patient is alert and oriented x 3.  SKIN: No obvious rash, lesion, or ulcer.   LABORATORY PANEL:   CBC  Recent Labs Lab 01-21-2017 1102  WBC 9.6  HGB 15.3  HCT 45.6  PLT 186  MCV 89.7  MCH 30.0  MCHC 33.5  RDW 17.0*  LYMPHSABS 0.5*  MONOABS 0.8  EOSABS 0.0  BASOSABS 0.1   ------------------------------------------------------------------------------------------------------------------  Chemistries   Recent Labs Lab Jan 21, 2017 1102  NA 139  K 4.6  CL 100*  CO2 28  GLUCOSE 125*  BUN 28*  CREATININE 0.94  CALCIUM 9.4  AST 27  ALT 17  ALKPHOS 88  BILITOT 1.1   ------------------------------------------------------------------------------------------------------------------ CrCl cannot be  calculated (Unknown ideal weight.). ------------------------------------------------------------------------------------------------------------------ No results for input(s): TSH, T4TOTAL, T3FREE, THYROIDAB in the last 72 hours.  Invalid input(s): FREET3   Coagulation profile No results for input(s): INR, PROTIME in the last 168 hours. ------------------------------------------------------------------------------------------------------------------- No results for input(s): DDIMER in the last 72 hours. -------------------------------------------------------------------------------------------------------------------  Cardiac Enzymes  Recent Labs Lab 21-Jan-2017 1102  TROPONINI <0.03   ------------------------------------------------------------------------------------------------------------------ Invalid input(s): POCBNP  ---------------------------------------------------------------------------------------------------------------  Urinalysis    Component Value Date/Time   COLORURINE Yellow 11/09/2013 1705   APPEARANCEUR Clear 06/04/2016 0902   LABSPEC 1.011 11/09/2013 1705   PHURINE 5.0 11/09/2013 1705   GLUCOSEU Negative 06/04/2016 0902   GLUCOSEU Negative 11/09/2013 1705   HGBUR 1+ 11/09/2013 1705   BILIRUBINUR Negative 06/04/2016 0902   BILIRUBINUR Negative 11/09/2013 1705   KETONESUR Negative 11/09/2013 1705   PROTEINUR Negative 06/04/2016 0902   PROTEINUR Negative 11/09/2013 1705   NITRITE Negative 06/04/2016 0902   NITRITE Negative 11/09/2013 1705   LEUKOCYTESUR Negative 06/04/2016 0902   LEUKOCYTESUR Negative 11/09/2013  1705     RADIOLOGY: Dg Chest Portable 1 View  Result Date: 01/20/17 CLINICAL DATA:  Shortness of breath. History CHF. kidney disease. Hypertension. EXAM: PORTABLE CHEST 1 VIEW COMPARISON:  01/02/2014 FINDINGS: Osteopenia. Midline trachea. Cardiomegaly accentuated by AP portable technique. Atherosclerosis in the transverse aorta. Small, right  larger than left pleural effusions. No pneumothorax. Moderate interstitial edema. Right worse than left base airspace disease. IMPRESSION: Moderate congestive heart failure. Right greater than left pleural effusions with adjacent airspace disease. This is most likely atelectasis. Concurrent infection or aspiration cannot be excluded. Aortic Atherosclerosis (ICD10-I70.0). Electronically Signed   By: Jeronimo Greaves M.D.   On: 2017/01/20 11:22    EKG: Orders placed or performed during the hospital encounter of 01-20-17  . ED EKG  . ED EKG  . EKG 12-Lead  . EKG 12-Lead    IMPRESSION AND PLAN:  * Ac respi failure    Ac diastolic CHF   Severe generalized edema    Bipap for 2-3 hrs in ER helped, now on nasal canula.   IV lasix, Montior Intake / Output, Fluid restrictions   Echo, serial troponin, tele monitor.   Cardio consult.    I explained about total fluid intake in a day ( As seems , she have > 2 ltr daily intake)  * A fib   Cardizem and xarelto  * COPD    Cont spiriva and inhaler.  All the records are reviewed and case discussed with ED provider. Management plans discussed with the patient, family and they are in agreement.  CODE STATUS: Full. Code Status History    This patient does not have a recorded code status. Please follow your organizational policy for patients in this situation.     Spoke to her husband in room.  TOTAL TIME TAKING CARE OF THIS PATIENT: 50 minutes.    Altamese Dilling M.D on 01/20/2017   Between 7am to 6pm - Pager - [phone]  After 6pm go to [email or web address] - password [email or web address]  [facility]  Office  [phone]  CC: Primary care physician; Steele Sizer, MD   Note: This dictation was prepared with Dragon dictation along with smaller phrase technology. Any transcriptional errors that result from this process are unintentional.

## 2017-01-19 ENCOUNTER — Encounter: Payer: Self-pay | Admitting: Physician Assistant

## 2017-01-19 ENCOUNTER — Inpatient Hospital Stay (HOSPITAL_COMMUNITY)
Admit: 2017-01-19 | Discharge: 2017-01-19 | Disposition: A | Discharge status: Home / Self Care | Payer: Medicare Other | Attending: Internal Medicine | Admitting: Internal Medicine

## 2017-01-19 ENCOUNTER — Inpatient Hospital Stay: Payer: Medicare Other

## 2017-01-19 DIAGNOSIS — J9622 Acute and chronic respiratory failure with hypercapnia: Secondary | ICD-10-CM

## 2017-01-19 DIAGNOSIS — Z7189 Other specified counseling: Secondary | ICD-10-CM

## 2017-01-19 DIAGNOSIS — J9621 Acute and chronic respiratory failure with hypoxia: Secondary | ICD-10-CM

## 2017-01-19 DIAGNOSIS — Z515 Encounter for palliative care: Secondary | ICD-10-CM

## 2017-01-19 DIAGNOSIS — J810 Acute pulmonary edema: Secondary | ICD-10-CM

## 2017-01-19 DIAGNOSIS — I5023 Acute on chronic systolic (congestive) heart failure: Secondary | ICD-10-CM

## 2017-01-19 DIAGNOSIS — I340 Nonrheumatic mitral (valve) insufficiency: Secondary | ICD-10-CM

## 2017-01-19 DIAGNOSIS — I482 Chronic atrial fibrillation: Secondary | ICD-10-CM

## 2017-01-19 LAB — GLUCOSE, CAPILLARY
GLUCOSE-CAPILLARY: 133 mg/dL — AB (ref 65–99)
GLUCOSE-CAPILLARY: 136 mg/dL — AB (ref 65–99)

## 2017-01-19 LAB — CBC
HEMATOCRIT: 41.5 % (ref 35.0–47.0)
HEMOGLOBIN: 13.7 g/dL (ref 12.0–16.0)
MCH: 30.2 pg (ref 26.0–34.0)
MCHC: 33.1 g/dL (ref 32.0–36.0)
MCV: 91.4 fL (ref 80.0–100.0)
Platelets: 158 10*3/uL (ref 150–440)
RBC: 4.54 MIL/uL (ref 3.80–5.20)
RDW: 16.8 % — ABNORMAL HIGH (ref 11.5–14.5)
WBC: 7.5 10*3/uL (ref 3.6–11.0)

## 2017-01-19 LAB — BASIC METABOLIC PANEL
ANION GAP: 6 (ref 5–15)
BUN: 26 mg/dL — ABNORMAL HIGH (ref 6–20)
CALCIUM: 8.9 mg/dL (ref 8.9–10.3)
CO2: 33 mmol/L — ABNORMAL HIGH (ref 22–32)
Chloride: 102 mmol/L (ref 101–111)
Creatinine, Ser: 0.88 mg/dL (ref 0.44–1.00)
GLUCOSE: 117 mg/dL — AB (ref 65–99)
POTASSIUM: 3.6 mmol/L (ref 3.5–5.1)
Sodium: 141 mmol/L (ref 135–145)

## 2017-01-19 LAB — BLOOD GAS, ARTERIAL
ACID-BASE EXCESS: 3.3 mmol/L — AB (ref 0.0–2.0)
Bicarbonate: 34.4 mmol/L — ABNORMAL HIGH (ref 20.0–28.0)
DELIVERY SYSTEMS: POSITIVE
Expiratory PAP: 5
FIO2: 1
INSPIRATORY PAP: 10
MECHANICAL RATE: 8
O2 Saturation: 95.4 %
Patient temperature: 37
pCO2 arterial: 88 mmHg (ref 32.0–48.0)
pH, Arterial: 7.2 — ABNORMAL LOW (ref 7.350–7.450)
pO2, Arterial: 94 mmHg (ref 83.0–108.0)

## 2017-01-19 LAB — TROPONIN I
TROPONIN I: 0.06 ng/mL — AB (ref <0.03)
Troponin I: 0.03 ng/mL (ref <0.03)
Troponin I: 0.04 ng/mL (ref <0.03)

## 2017-01-19 LAB — APTT: APTT: 37 s — AB (ref 24–36)

## 2017-01-19 LAB — MAGNESIUM: MAGNESIUM: 2.1 mg/dL (ref 1.7–2.4)

## 2017-01-19 LAB — PROTIME-INR
INR: 3.04
PROTHROMBIN TIME: 31.2 s — AB (ref 11.4–15.2)

## 2017-01-19 LAB — MRSA PCR SCREENING: MRSA BY PCR: NEGATIVE

## 2017-01-19 LAB — PHOSPHORUS: PHOSPHORUS: 5.4 mg/dL — AB (ref 2.5–4.6)

## 2017-01-19 LAB — LACTIC ACID, PLASMA: Lactic Acid, Venous: 1.3 mmol/L (ref 0.5–1.9)

## 2017-01-19 LAB — HEPARIN LEVEL (UNFRACTIONATED)

## 2017-01-19 MED ORDER — DOPAMINE-DEXTROSE 3.2-5 MG/ML-% IV SOLN
0.0000 ug/kg/min | INTRAVENOUS | Status: DC
  Administered 2017-01-19: 5 ug/kg/min via INTRAVENOUS
  Filled 2017-01-19: qty 250

## 2017-01-19 MED ORDER — METOPROLOL TARTRATE 5 MG/5ML IV SOLN: 2.5000 mg | INTRAVENOUS | Status: DC | PRN

## 2017-01-19 MED ORDER — SODIUM CHLORIDE 0.9 % IV SOLN
0.0000 ug/min | INTRAVENOUS | Status: DC
  Administered 2017-01-19: 100 ug/min via INTRAVENOUS
  Administered 2017-01-19: 20 ug/min via INTRAVENOUS
  Administered 2017-01-19 (×3): 100 ug/min via INTRAVENOUS
  Administered 2017-01-20: 60 ug/min via INTRAVENOUS
  Administered 2017-01-20: 90 ug/min via INTRAVENOUS
  Administered 2017-01-20: 80 ug/min via INTRAVENOUS
  Administered 2017-01-20: 50 ug/min via INTRAVENOUS
  Administered 2017-01-20: 70 ug/min via INTRAVENOUS
  Administered 2017-01-20: 60 ug/min via INTRAVENOUS
  Administered 2017-01-21: 10 ug/min via INTRAVENOUS
  Filled 2017-01-19: qty 1
  Filled 2017-01-19: qty 10
  Filled 2017-01-19 (×2): qty 1
  Filled 2017-01-19 (×2): qty 10
  Filled 2017-01-19: qty 1
  Filled 2017-01-19 (×2): qty 10
  Filled 2017-01-19 (×3): qty 1
  Filled 2017-01-19 (×2): qty 10
  Filled 2017-01-19: qty 1

## 2017-01-19 MED ORDER — ORAL CARE MOUTH RINSE
15.0000 mL | Freq: Two times a day (BID) | OROMUCOSAL | Status: DC
  Administered 2017-01-19 – 2017-01-24 (×10): 15 mL via OROMUCOSAL

## 2017-01-19 MED ORDER — DOPAMINE-DEXTROSE 3.2-5 MG/ML-% IV SOLN
0.0000 ug/kg/min | INTRAVENOUS | Status: DC
  Administered 2017-01-19 – 2017-01-20 (×2): 4 ug/kg/min via INTRAVENOUS
  Filled 2017-01-19: qty 250

## 2017-01-19 MED ORDER — CHLORHEXIDINE GLUCONATE 0.12 % MT SOLN
15.0000 mL | Freq: Two times a day (BID) | OROMUCOSAL | Status: DC
  Administered 2017-01-19 – 2017-01-24 (×6): 15 mL via OROMUCOSAL
  Filled 2017-01-19 (×4): qty 15

## 2017-01-19 MED ORDER — HEPARIN (PORCINE) IN NACL 100-0.45 UNIT/ML-% IJ SOLN
1000.0000 [IU]/h | INTRAMUSCULAR | Status: DC
  Administered 2017-01-19: 1000 [IU]/h via INTRAVENOUS
  Filled 2017-01-19: qty 250

## 2017-01-19 NOTE — Progress Notes (Signed)
Admitted this morning for acute on chronic systolic heart failure with respiratory failure and hypoxia. Initially admitted to telemetry then transferred to ICU because ofworsening shortness of breath requiring BiPAP support. Patient is receiving IV Lasix, now on nasal cannula.  Blood pressure is low so she is on dopamine, Neo-Synephrine. Plan: acute on chronic combined systolic CHF, cardiorenal syndrome: Possible low output: Continue dopamine, Neo-Synephrine, IV Lasix as BP permits. Hold Coreg due to hypotension, follow echocardiogram. 2 COPD exacerbation: Continue bronchodilators.  #3 chronic atrial fibrillation: Rate controlled. Continue heparin drip, change to Xarelto when stable. CODE STATUS partial code Time spent 20 minutes

## 2017-01-19 NOTE — Progress Notes (Signed)
RN called to patient room for O2 saturation at 85%. Pt agreed to return to mask. Denying SOB. Will continue to assess.

## 2017-01-19 NOTE — Progress Notes (Signed)
Notified MD of pt's o2 sats@ 86% with 6 liters. Pt stated feeling sob, with some expiratory wheezes. Orders placed. Will continue to monitor and assess.

## 2017-01-19 NOTE — Consult Note (Signed)
PULMONARY / CRITICAL CARE MEDICINE   Name: Julie Tate MRN: [id number] DOB: [birth year]    ADMISSION DATE:  2017/01/23   CONSULTATION DATE:  01/19/2017  REFERRING MD:  Dr. Tobi Bastos  Reason: Acute respiratory failure  CHIEF COMPLAINT:  Hypoxia and decreased consciousness  HISTORY OF PRESENT ILLNESS:   This is an 82 year old African-American female with a past medical history as indicated below was admitted with worsening shortness of breath and acute hypoxic respiratory failure. She was placed on BiPAP with some improvement. Hence, she was admitted to the floor. This morning, a CODE BLUE was called because patient was found barely responsive, bradycardic, with high high flow nasal cannula off.  The CODE BLUE was canceled once patient was found to have a pulse. However, she remained hypoxic and was placed on BiPAP and transferred to the ICU for further management. Of note, her chest x-ray upon admission showed bilateral pleural effusion with left greater than right. She does have a history of chronic pleural effusions.   PAST MEDICAL HISTORY :  She  has a past medical history of Adrenal mass (HCC); Asthma; Atrial fibrillation (HCC); CHF (congestive heart failure) (HCC); CKD (chronic kidney disease), stage III; Hypertension; Osteopenia; Proteinuria; Sarcoidosis; and Vitamin D deficiency.  PAST SURGICAL HISTORY: She  has a past surgical history that includes Abdominal hysterectomy and Eye surgery.  Allergies  Allergen Reactions  . Entex Lq [Phenylephrine-Guaifenesin]   . Norvasc [Amlodipine Besylate]     No current facility-administered medications on file prior to encounter.    Current Outpatient Prescriptions on File Prior to Encounter  Medication Sig  . carvedilol (COREG) 25 MG tablet Take 1 tablet (25 mg total) by mouth 2 (two) times daily with a meal.  . diltiazem (CARDIZEM CD) 240 MG 24 hr capsule Take 1 capsule (240 mg total) by mouth daily.  . furosemide (LASIX) 20 MG  tablet Take 1 tablet (20 mg total) by mouth 2 (two) times daily.  . Loratadine (CLARITIN PO) Take by mouth daily as needed.  . potassium chloride (KLOR-CON) 8 MEQ tablet Take 1 tablet (8 mEq total) by mouth daily.  . Rivaroxaban (XARELTO) 15 MG TABS tablet Take 1 tablet (15 mg total) by mouth daily.  Marland Kitchen tiotropium (SPIRIVA HANDIHALER) 18 MCG inhalation capsule Place 1 capsule (18 mcg total) into inhaler and inhale daily.  Marland Kitchen FLUoxetine (PROZAC) 20 MG tablet Take 1 tablet (20 mg total) by mouth daily. (Patient not taking: Reported on 01/12/2017)  . venlafaxine XR (EFFEXOR-XR) 37.5 MG 24 hr capsule Take 1 capsule (37.5 mg total) by mouth daily with breakfast. (Patient not taking: Reported on 01/23/2017)  . venlafaxine XR (EFFEXOR-XR) 75 MG 24 hr capsule Take 1 capsule (75 mg total) by mouth daily with breakfast. (Patient not taking: Reported on January 23, 2017)    FAMILY HISTORY:  Her indicated that her mother is deceased. She indicated that her father is deceased. She indicated that her maternal grandmother is deceased. She indicated that her maternal grandfather is deceased. She indicated that her paternal grandmother is deceased. She indicated that her paternal grandfather is deceased. She indicated that her son is alive.    SOCIAL HISTORY: She  reports that she has never smoked. She has never used smokeless tobacco. She reports that she does not drink alcohol or use drugs.  REVIEW OF SYSTEMS:   Unable to obtain as patient is currently somnolent  SUBJECTIVE:    VITAL SIGNS: BP (!) 104/56 (BP Location: Left Arm)   Pulse 77   Temp Marland Kitchen)  96.5 F (35.8 C) (Axillary)   Resp 18   Ht  (1.651 m)   Wt 166 lb 14.2 oz (75.7 kg)   LMP  (LMP Unknown)   SpO2 94%   BMI 27.77 kg/m   HEMODYNAMICS:    VENTILATOR SETTINGS: FiO2 (%):  [36 %-60 %] 60 %  INTAKE / OUTPUT: I/O last 3 completed shifts: In: 240 [P.O.:240] Out: -   PHYSICAL EXAMINATION: General: Cachectic, acutely ill-looking Neuro:  Somnolent, opens eyes to voice, speech is slurred, moves all extremities HEENT: PERRLA, oral mucosa dry, trachea midline Cardiovascular:  Apical pulse irregular, mildly bradycardic, S1, S2, no murmur, regurg or gallop, +2 pulses bilaterally, +3 pitting edema bilaterally Lungs: Increased work of breathing, breath sounds diminished in all lung fields, positive Rales in bilateral lower lung fields, no wheezing Abdomen: Nondistended, normal bowel sounds, no focal Musculoskeletal:  Swelling and no deformities Skin:  Warm and dry  LABS:  BMET  Recent Labs Lab January 26, 2017 1102 01/19/17 0021  NA 139 141  K 4.6 3.6  CL 100* 102  CO2 28 33*  BUN 28* 26*  CREATININE 0.94 0.88  GLUCOSE 125* 117*    Electrolytes  Recent Labs Lab 01/26/17 1102 01/19/17 0021  CALCIUM 9.4 8.9    CBC  Recent Labs Lab 01-26-17 1102 01/19/17 0021  WBC 9.6 7.5  HGB 15.3 13.7  HCT 45.6 41.5  PLT 186 158    Coag's No results for input(s): APTT, INR in the last 168 hours.  Sepsis Markers No results for input(s): LATICACIDVEN, PROCALCITON, O2SATVEN in the last 168 hours.  ABG No results for input(s): PHART, PCO2ART, PO2ART in the last 168 hours.  Liver Enzymes  Recent Labs Lab 01-26-17 1102  AST 27  ALT 17  ALKPHOS 88  BILITOT 1.1  ALBUMIN 4.1    Cardiac Enzymes  Recent Labs Lab 2017-01-26 1705 01/26/17 2052 01/19/17 0021  TROPONINI <0.03 <0.03 <0.03    Glucose  Recent Labs Lab 01/19/17 0245 01/19/17 0321  GLUCAP 136* 133*    Imaging Dg Chest Portable 1 View  Result Date: 2017-01-26 CLINICAL DATA:  Shortness of breath. History CHF. kidney disease. Hypertension. EXAM: PORTABLE CHEST 1 VIEW COMPARISON:  01/02/2014 FINDINGS: Osteopenia. Midline trachea. Cardiomegaly accentuated by AP portable technique. Atherosclerosis in the transverse aorta. Small, right larger than left pleural effusions. No pneumothorax. Moderate interstitial edema. Right worse than left base airspace  disease. IMPRESSION: Moderate congestive heart failure. Right greater than left pleural effusions with adjacent airspace disease. This is most likely atelectasis. Concurrent infection or aspiration cannot be excluded. Aortic Atherosclerosis (ICD10-I70.0). Electronically Signed   By: Jeronimo Greaves M.D.   On: 2017/01/26 11:22   STUDIES:  2-D echo pending   SIGNIFICANT EVENTS: 01/26/2017: Admitted with acute hypoxemic respiratory failure and bilateral pleural effusions  LINES/TUBES: Peripheral IVs Right EJ  DISCUSSION: This is an 82 year old African-American female with an extensive medical history, presenting with acute hypoxic/ hypercarbic respiratory failure secondary to worsening congestive heart failure and bilateral pleural effusions, hypotension and atrial fibrillation  ASSESSMENT  Acute hypoxemic/hypercarbic respiratory failure. Bilateral pleural effusion Hypotension Atrial fibrillation with slow ventricular rate. History of: Congestive heart failure, chronic kidney disease, congestive heart failure, asthma and hypertension  PLAN Hemodynamics per ICU. Continuous BiPAP and titrate off as tolerated Start dopamine and titrate to maintain mean arterial blood pressure greater than 65 IV diuresis Cardiology consulted. 2-D echo medications Resume all home medications when mental status improves and patient is able to tolerate oral intake. Palliative care  consult for goals GI and DVT prophylaxis  FAMILY  - Updates: No family at bedside. We'll updated when available  - Inter-disciplinary family meet or Palliative Care meeting due by:  day 7  Magdalene [facility] ANP-BC Pulmonary and Critical Care Medicine [facility] Pager [phone] or [phone] 01/19/2017, 4:26 AM   PCCM ATTENDING ATTESTATION:  I have evaluated patient with the APP Tukov, reviewed database in its entirety and discussed care plan in detail. In addition, this patient was discussed on  multidisciplinary rounds.   Important exam findings: Somnolent Very frail appearing Able to follow some commands Initially on BiPAP Mildly increased WOB after removal of BiPAP Bibasilar crackles No wheezes IR IR, no M - rate well controlled Abdomen soft, NT, NABS Chronic stasis changes with symmetric LE edema  CXR: Edema pattern with bilateral pleural effusions R > L   Major problems addressed by PCCM team: Severe debilitation/frailty at baseline Acute on chronic hypoxic/hypercarbic respiratory failure Pulmonary edema History of sarcoidosis Chronic atrial fibrillation, rate controlled Hypotension Hypervolemia   PLAN/REC: Diurese this as permitted by BP and renal function BiPAP as needed Phenylephrine as needed to maintain MAP > 60 mmHg Echocardiogram ordered Discussed with cardiology Goals of care discussed with patient's son - DNR in event of cardiopulmonary arrest  Noninvasive ventilation okay   Billy Fischer, MD PCCM service Mobile [phone] Pager [phone] 01/19/2017 1:40 PM

## 2017-01-19 NOTE — Consult Note (Signed)
Cardiology Consultation Note  Patient ID: Julie Tate, MRN: [id number], DOB/AGE: 07/25/1934 82 y.o. Admit date: 01/08/2017   Date of Consult: 01/19/2017 Primary Physician: Steele Sizer, MD Primary Cardiologist: New to [facility] - consult by Kirke Corin Requesting Physician: Dr. Elisabeth Pigeon, MD  Chief Complaint: SOB/lower extremity swelling Reason for Consult: Acute on chronic combined CHF  HPI: Julie Tate is a 82 y.o. female who is being seen today for the evaluation of acute on chronic combined CHF at the request of Dr. Elisabeth Pigeon, MD. Patient has a h/o chronic systolic CHF (etiology unclear at this time), chronic Afib previously on Pradaxa now on Xarelto, sarcoidosis, HTN, asthma, and anxiety/depression who has been noting increased SOB and lower extremity swelling the past 6-12 months, worse the past two days leading to her admission.   Patient was previously seen by Dr. Darrold Junker in 11/2013 for consultation of acute on chronic systolic CHF. It was noted at that time the patient had a prior EF of 45% (etiology unclear). She was also on Pradaxa for Afib/flutter. She has since been followed by Dr. Welton Flakes, though has not seen him as an outpatient is several years per the husband's report. Over the past 6-12 months the patient has noted increased lower extremity swelling, early satiety, and orthopnea - now requiring to sit up in a recliner to sleep the past couple of weeks. Patient's husband indicates she does weight herself intermittently, though is not clear on her weight trend lately. Husband reports Dr. Welton Flakes has previously ran a stress test on her, though he does not know the results. Over the prior two to three days she has noted a further worsening of her SOB and lower extremity edema. She was not taking extra Lasix (takes 20 mg bid), along with Coreg and Cardizem. No chest pain. Because of her SOB she presented to [facility].   Upon the patient's arrival to [facility] they were found to have stable BP and heart  rate, oxygen saturation 85% on room air, weight 167 pounds. EKG not acute as below, CXR showed right greater than left pleural effusions with possible adjacent airspace disease. She initially required BiPAP, though was able to be weaned to nasal cannula by time of admission. Labs showed BNP 395, troponin negative x 4, SCr 0.94, K+ 3.6, magnesium 2.1. She was started on IV Lasix with a documented UOP of 600 mL for the admission to date. Overnight, the patient complained of increased SOB requiring BiPAP. Patient was not tolerating her BiPAP and self discontinued this. This led to a drop in her oxygen saturation to 80% followed by hypotension and bradycardia into the 30s bpm. She never lost a pulse per prior notes. Initially a CODE BLUE was called, though this was cancelled and she was placed back on BiPAP with improvement in vital signs. Repeat troponin elevated at 0.03. She was transferred to step down unit. She remains on BiPAP at this time.   Past Medical History:  Diagnosis Date  . Adrenal mass (HCC)   . Asthma   . Chronic atrial fibrillation (HCC)    a. CHADS2VASc => 5 (CHF, HTN, age x 2, female); b. on Xarelto  . Chronic systolic CHF (congestive heart failure) (HCC)    a. reported EF 45% in ~ 2015  . Hypertension   . Osteopenia   . Proteinuria   . Sarcoidosis   . Vitamin D deficiency       Most Recent Cardiac Studies: Remote echo with prior EF of 45% per outside  cardiology note 11/2013   Surgical History:  Past Surgical History:  Procedure Laterality Date  . ABDOMINAL HYSTERECTOMY    . EYE SURGERY     cataract     Home Meds: Prior to Admission medications   Medication Sig Start Date End Date Taking? Authorizing Provider  carvedilol (COREG) 25 MG tablet Take 1 tablet (25 mg total) by mouth 2 (two) times daily with a meal. 06/04/16  Yes Crissman, Redge Gainer, MD  diltiazem (CARDIZEM CD) 240 MG 24 hr capsule Take 1 capsule (240 mg total) by mouth daily. 06/04/16  Yes Crissman, Redge Gainer, MD    furosemide (LASIX) 20 MG tablet Take 1 tablet (20 mg total) by mouth 2 (two) times daily. 06/04/16  Yes Crissman, Redge Gainer, MD  Loratadine (CLARITIN PO) Take by mouth daily as needed.   Yes [provider]  potassium chloride (KLOR-CON) 8 MEQ tablet Take 1 tablet (8 mEq total) by mouth daily. 06/04/16  Yes Crissman, Redge Gainer, MD  Rivaroxaban (XARELTO) 15 MG TABS tablet Take 1 tablet (15 mg total) by mouth daily. 12/01/16  Yes Crissman, Redge Gainer, MD  tiotropium (SPIRIVA HANDIHALER) 18 MCG inhalation capsule Place 1 capsule (18 mcg total) into inhaler and inhale daily. 06/04/16  Yes Crissman, Redge Gainer, MD  triamcinolone (NASACORT ALLERGY 24HR) 55 MCG/ACT AERO nasal inhaler Place 2 sprays into the nose daily.   Yes [provider]  citalopram (CELEXA) 20 MG tablet Take 1 tablet by mouth daily. 12/01/16   [provider]  FLUoxetine (PROZAC) 20 MG tablet Take 1 tablet (20 mg total) by mouth daily. Patient not taking: Reported on 01/12/2017 12/15/16   Steele Sizer, MD  venlafaxine XR (EFFEXOR-XR) 37.5 MG 24 hr capsule Take 1 capsule (37.5 mg total) by mouth daily with breakfast. Patient not taking: Reported on 01/23/2017 01/12/17   Steele Sizer, MD  venlafaxine XR (EFFEXOR-XR) 75 MG 24 hr capsule Take 1 capsule (75 mg total) by mouth daily with breakfast. Patient not taking: Reported on 01/19/2017 01/12/17   Steele Sizer, MD    Inpatient Medications:  . chlorhexidine  15 mL Mouth Rinse BID  . citalopram  20 mg Oral Daily  . furosemide  20 mg Intravenous Q8H  . mouth rinse  15 mL Mouth Rinse q12n4p  . potassium chloride  20 mEq Oral Daily   . DOPamine 5 mcg/kg/min (01/19/17 0744)    Allergies:  Allergies  Allergen Reactions  . Entex Lq [Phenylephrine-Guaifenesin]   . Norvasc [Amlodipine Besylate]     Social History   Social History  . Marital status: Married    Spouse name: N/A  . Number of children: N/A  . Years of education: N/A   Occupational History  . Not  on file.   Social History Main Topics  . Smoking status: Never Smoker  . Smokeless tobacco: Never Used  . Alcohol use No  . Drug use: No  . Sexual activity: No   Other Topics Concern  . Not on file   Social History Narrative  . No narrative on file     Family History  Problem Relation Age of Onset  . Cancer Mother        colon  . Cancer Father        bone  . Heart disease Paternal Grandfather      Review of Systems: Review of Systems  Unable to perform ROS: Critical illness   On BiPAP.  Labs:  Recent Labs  01/08/2017 1705  01/03/2017 2052 01/19/17 0021 01/19/17 0632  TROPONINI <0.03 <0.03 <0.03 0.03*   Lab Results  Component Value Date   WBC 7.5 01/19/2017   HGB 13.7 01/19/2017   HCT 41.5 01/19/2017   MCV 91.4 01/19/2017   PLT 158 01/19/2017     Recent Labs Lab 01/22/2017 1102 01/19/17 0021  NA 139 141  K 4.6 3.6  CL 100* 102  CO2 28 33*  BUN 28* 26*  CREATININE 0.94 0.88  CALCIUM 9.4 8.9  PROT 7.9  --   BILITOT 1.1  --   ALKPHOS 88  --   ALT 17  --   AST 27  --   GLUCOSE 125* 117*   Lab Results  Component Value Date   CHOL 154 06/04/2016   HDL 53 06/04/2016   LDLCALC 85 06/04/2016   TRIG 78 06/04/2016   No results found for: DDIMER  Radiology/Studies:  Dg Chest Port 1 View  Result Date: 01/19/2017 IMPRESSION: Diffuse bilateral airspace disease slightly worsened since prior study. Small effusions. Electronically Signed   By: Charlett Nose M.D.   On: 01/19/2017 07:45   Dg Chest Portable 1 View  Result Date: 01/25/2017 IMPRESSION: Moderate congestive heart failure. Right greater than left pleural effusions with adjacent airspace disease. This is most likely atelectasis. Concurrent infection or aspiration cannot be excluded. Aortic Atherosclerosis (ICD10-I70.0). Electronically Signed   By: Jeronimo Greaves M.D.   On: 01/07/2017 11:22    EKG: Interpreted by me showed: Afib, 84 bpm, nonspecific st/t changes - repeat on 9/17 with Afib, 78 bpm, low  voltage QRS, nonspecific st/t changes Telemetry: Interpreted by me showed: Afib, 30s to 80s bpm  Weights: Filed Weights   01/25/2017 1613 01/12/2017 1705 01/19/17 0300  Weight: 167 lb 1.7 oz (75.8 kg) 168 lb 6.4 oz (76.4 kg) 166 lb 14.2 oz (75.7 kg)     Physical Exam: Blood pressure (!) 100/46, pulse 91, temperature (!) 94.3 F (34.6 C), temperature source Axillary, resp. rate 12, height  (1.651 m), weight 166 lb 14.2 oz (75.7 kg), SpO2 92 %. Body mass index is 27.77 kg/m. General: Ill and frail appearing, in no acute distress. Head: Normocephalic, atraumatic, sclera non-icteric, no xanthomas, nares are without discharge.  Neck: Negative for carotid bruits. JVD difficult to assess 2/2 right EJ catheter. Lungs: Diminished breath sounds bilaterally with bibasilar crackles. Breathing is unlabored. On BiPAP.  Heart: Irregularly irregular with S1 S2. No murmurs, rubs, or gallops appreciated. Abdomen: Soft, non-tender, non-distended with normoactive bowel sounds. No hepatomegaly. No rebound/guarding. No obvious abdominal masses. Msk:  Strength and tone appear normal for age. Extremities: No clubbing or cyanosis. 1+ bilateral pitting edema. Distal pedal pulses are 2+ and equal bilaterally. Neuro: Sleeping. Psych:  Sleeping.    Assessment and Plan:  Principal Problem:   Acute diastolic CHF (congestive heart failure) (HCC) Active Problems:   Acute respiratory failure (HCC)   Palliative care encounter   Goals of care, counseling/discussion    1. Acute respiratory failure with hypoxia and hypercarbia: -Requiring BiPAP, wean as able per PCCM -Likely multifactorial including acute on chronic combined CHF and possible component of AECOPD  2. Acute on chronic combined CHF/cardiorenal syndrome: -Possible low output -Dopamine for MAP > 65 mmHg per PCCM, wean as able 9/17 -IV Lasix with close monitoring of renal function, repletion of potassium and strict Is and Os -Hold Coreg given need  for dopamine currently -Restart evidence based heart failure regimen as BP allows -TTE pending -If EF is found to  be worse, will need ischemic evaluation prior to discharge   3. Elevated troponin: -Continue to cycle until peak -Transition from Xarelto to heparin gtt given patient's Afib, elevated troponin and possible worsening EF -Pending echo, may need R/LHC prior to discharge -TTE pending as above  4. Chronic Afib: -Rate controlled -Hold Coreg as above given hypotension -Change Xarelto to heparin gtt as above for uninterrupted anticoagulation  -CHADS2VASc at least 5 (CHF, HTN, age x 2, female)  5. AECOPD: -Per IM  6. Hyperglycemia: -Check HGB A1c   Signed, Eula Listen, PA-C [facility] Pager: [phone] 01/19/2017, 10:09 AM

## 2017-01-19 NOTE — Progress Notes (Signed)
Into pt room. Pt stating feeling sob, pulse ox 89% On 4 liters. Placed on 6liters and notified respiratory to come and assess.

## 2017-01-19 NOTE — Progress Notes (Signed)
Bladder scan completed per Dr.Simmonds. Result in 0 mL. Bladder scan repeated with the same results. Will continue to assess.

## 2017-01-19 NOTE — Progress Notes (Addendum)
Was notified of by CCMD of hr in 20s. Went into room and found pt on RA, agonal breathing with pulse, unresponsive, unresponsive to sternal rub. .Rapid response called. BP low, placed on Bipap. Transfer ICU

## 2017-01-19 NOTE — Care Management (Signed)
Spoke with MD about NIV at home; NIV is not needed at this time. Palliative consult pending per MD.

## 2017-01-19 NOTE — Progress Notes (Signed)
Notified husband of tx of pt to Rm 12 in ICU. Husband stated ok.

## 2017-01-19 NOTE — Progress Notes (Signed)
Dr.Simmonds called and notified that patients HR has begun to drop to the 50's, and remains irregular. Pt HR did drop to 47 bpm twice and returned to 60 bpm currently. Orders to restart Dopamine at 4 mcg/kg/hr and continue to titrate Neo synephrine for BP. Orders read back and acknowledged. Will continue to assess.

## 2017-01-19 NOTE — Progress Notes (Signed)
[facility] responded to a RR for room 244. Pt had removed Bipap which caused low Oxygen levels. Pt will be transferred to step down unit.    01/19/17 0300  Clinical Encounter Type  Visited With Patient;Health care provider  Visit Type Initial  Referral From Nurse  Consult/Referral To Chaplain

## 2017-01-19 NOTE — Consult Note (Signed)
Consultation Note Date: 01/19/2017   Patient Name: Julie Tate  DOB: 08-25-1934  MRN: [id number]  Age / Sex: 82 y.o., female  PCP: Guadalupe Maple, MD Referring Physician: Epifanio Lesches, MD  Reason for Consultation: Establishing goals of care  HPI/Patient Profile: 82 y.o. female  with past medical history of sarcoid, CKD III, CHF (EF 45) Afib, Chronic pleural effusion and an adrenal mass who was admitted on 01/11/2017 with respiratory failure secondary to CHF. She was being treated with bipap.  Over night (early morning 9/17) a code blue was called.  The patient retained a pulse but she was hypotensive, hypothermic, and her pulse rate was in the 20s.  On my exam this morning she is on pressors and minimally responsive.  She requires Bipap.  Clinical Assessment and Goals of Care:  I have reviewed medical records including EPIC notes, labs and imaging, received report from the bedside RN and CCM physician, assessed the patient and then met at the bedside along with her husband Braulio Conte and son Sherren Mocha  to discuss diagnosis prognosis, [place], EOL wishes, disposition and options.  I introduced Palliative Medicine as specialized medical care for people living with serious illness. It focuses on providing relief from the symptoms and stress of a serious illness. The goal is to improve quality of life for both the patient and the family.  As far as functional and nutritional status at home she has been eating well.  Her husband cares for her.  Her main difficulty has been the swelling in her legs that has prevented her from walking.  Per RN family reported patient had been hallucinating at home for the last few days.  She had also become incontinent in the last few days.  We discussed her heart failure - and how it has likely caused the swelling in her legs and is greatly contributing to her respiratory  failure.  The difference between aggressive medical intervention and comfort care was considered in light of the patient's goals of care.  Both Braulio Conte and [place] felt that they did not want the patient to go thru aggressive resuscitation that was unlikely to improve her quality of life.  We changed her code status to DNR - with the exception of allowing BIPAP.   Questions and concerns were addressed.  The family was encouraged to call with questions or concerns.    Primary Decision Maker:  NEXT OF KIN husband Braulio Conte    SUMMARY OF RECOMMENDATIONS    PMT will follow with you to continue conversations around goals of care as the patient progresses.  Code Status/Advance Care Planning:  PARTIAL (DNR outside of the hospital), family wants to allow Bipap.   Symptom Management:   Per primary team  Additional Recommendations (Limitations, Scope, Preferences):  Full Scope Treatment   Psycho-social/Spiritual:   Desire for further Chaplaincy support: yes.  Family's pastor has been present.  Prognosis:  To be determined.  She is at high risk for an acute event given advanced heart failure and chronic respiratory failure.  Discharge Planning: To Be Determined      Primary Diagnoses: Present on Admission: **None**   I have reviewed the medical record, interviewed the patient and family, and examined the patient. The following aspects are pertinent.  Past Medical History:  Diagnosis Date  . Adrenal mass ([place])   . Asthma   . Atrial fibrillation ([place])   . CHF (congestive heart failure) ([place])   . CKD (chronic kidney disease), stage III   . Hypertension   . Osteopenia   . Proteinuria   . Sarcoidosis   . Vitamin D deficiency    Social History   Social History  . Marital status: Married    Spouse name: N/A  . Number of children: N/A  . Years of education: N/A   Social History Main Topics  . Smoking status: Never Smoker  . Smokeless tobacco: Never Used  . Alcohol  use No  . Drug use: No  . Sexual activity: No   Other Topics Concern  . None   Social History Narrative  . None   Family History  Problem Relation Age of Onset  . Cancer Mother        colon  . Cancer Father        bone  . Heart disease Paternal Grandfather    Scheduled Meds: . carvedilol  25 mg Oral BID WC  . chlorhexidine  15 mL Mouth Rinse BID  . citalopram  20 mg Oral Daily  . diltiazem  240 mg Oral QPM  . furosemide  20 mg Intravenous Q8H  . mouth rinse  15 mL Mouth Rinse q12n4p  . potassium chloride  20 mEq Oral Daily  . Rivaroxaban  15 mg Oral Daily  . tiotropium  18 mcg Inhalation Daily   Continuous Infusions: . DOPamine 5 mcg/kg/min (01/19/17 0744)   PRN Meds:.docusate sodium, ipratropium-albuterol Allergies  Allergen Reactions  . Entex Lq [Phenylephrine-Guaifenesin]   . Norvasc [Amlodipine Besylate]    Review of Systems patient on bipap  Physical Exam  Thin frail female on Bipap CV reg rate resp (on bipap, no distress) Abdomen thin, soft, nt  Vital Signs: BP (!) 100/46   Pulse 91   Temp (!) 94.3 F (34.6 C) (Axillary)   Resp 12   Ht _0  (1.651 m)   Wt 75.7 kg (166 lb 14.2 oz)   LMP  (LMP Unknown)   SpO2 92%   BMI 27.77 kg/m  Pain Assessment: CPOT POSS *See Group Information*: 2-Acceptable,Slightly drowsy, easily aroused Pain Score: Asleep   SpO2: SpO2: 92 % O2 Device:SpO2: 92 % O2 Flow Rate: .O2 Flow Rate (L/min): 40 L/min  IO: Intake/output summary:  Intake/Output Summary (Last 24 hours) at 01/19/17 0936 Last data filed at 01/19/17 0744  Gross per 24 hour  Intake           259.88 ml  Output              900 ml  Net          -640.12 ml    LBM: Last BM Date: 01/28/2017 Baseline Weight: Weight: 75.8 kg (167 lb 1.7 oz) Most recent weight: Weight: 75.7 kg (166 lb 14.2 oz)     Palliative Assessment/Data:     Time In: 10:50 Time Out: 12:00 Time Total: 70 min. Greater than 50%  of this time was spent counseling and coordinating  care related to the above assessment and plan.  Signed by: Florentina Jenny, PA-C Palliative Medicine Pager: [phone]  Please contact  Palliative Medicine Team phone at [phone] for questions and concerns.  For individual provider: See Shea Evans

## 2017-01-19 NOTE — Progress Notes (Signed)
Patient transferred from floor d/t low HR.  Patient is resting, alert, drowsy.  Right EJ in.  May draw blood from EJ per NP.

## 2017-01-19 NOTE — Progress Notes (Signed)
Code blue called Patient has BP and pulse Pulled off high flow oxygen and desaturated to 80 % BP was 60/40 mm of Hg Code blue cancelled Will transfer patient to step down unit BIPAP for hypoxia IV pressor if needed for support of BP

## 2017-01-19 NOTE — Progress Notes (Addendum)
ANTICOAGULATION CONSULT NOTE  Pharmacy Consult for Heparin Indication: atrial fibrillation  Allergies  Allergen Reactions  . Entex Lq [Phenylephrine-Guaifenesin]   . Norvasc [Amlodipine Besylate]     Patient Measurements: Height:  (165.1 cm) Weight: 166 lb 14.2 oz (75.7 kg) IBW/kg (Calculated) : 57 Heparin Dosing Weight: 72.6 kg  Vital Signs: Temp: 94.4 F (34.7 C) (09/17 1346) Temp Source: Axillary (09/17 1346) BP: 99/43 (09/17 1400) Pulse Rate: 70 (09/17 1400)  Labs:  Recent Labs  02-15-2017 1102  01/19/17 0021 01/19/17 0632 01/19/17 1220  HGB 15.3  --  13.7  --   --   HCT 45.6  --  41.5  --   --   PLT 186  --  158  --   --   CREATININE 0.94  --  0.88  --   --   TROPONINI <0.03  < > <0.03 0.03* 0.04*  < > = values in this interval not displayed.  Estimated Creatinine Clearance: 51.9 mL/min (by C-G formula based on SCr of 0.88 mg/dL).   Medical History: Past Medical History:  Diagnosis Date  . Adrenal mass (HCC)   . Asthma   . Chronic atrial fibrillation (HCC)    a. CHADS2VASc => 5 (CHF, HTN, age x 2, female); b. on Xarelto  . Chronic systolic CHF (congestive heart failure) (HCC)    a. reported EF 45% in ~ 2015  . Hypertension   . Osteopenia   . Proteinuria   . Sarcoidosis   . Vitamin D deficiency     Assessment: 82 y/o F with a h/o atrial fibrillation on rivaroxaban to transition to heparin due to elevated troponin and worsening EF. Last dose of rivaroxaban was 1700 9/16.   Goal of Therapy:  APTT 68-102 Heparin level 0.3-0.7 units/ml Monitor platelets by anticoagulation protocol: Yes   Plan:  Start heparin infusion at 1000 units/hr Check anti-Xa level in 8 hours and daily while on heparin Continue to monitor H&H and platelets  Luisa Hart D 01/19/2017,3:25 PM    0918 0300 aPTT >160. Hold drip x 1 hour and restart at 800 units/hr. Recheck aPTT 6 hours after restart.  Fulton Reek, PharmD, BCPS  01/20/17 4:43 AM

## 2017-01-20 ENCOUNTER — Inpatient Hospital Stay: Payer: Medicare Other

## 2017-01-20 DIAGNOSIS — I5031 Acute diastolic (congestive) heart failure: Secondary | ICD-10-CM

## 2017-01-20 DIAGNOSIS — J9601 Acute respiratory failure with hypoxia: Secondary | ICD-10-CM

## 2017-01-20 DIAGNOSIS — I272 Pulmonary hypertension, unspecified: Secondary | ICD-10-CM

## 2017-01-20 DIAGNOSIS — J90 Pleural effusion, not elsewhere classified: Secondary | ICD-10-CM

## 2017-01-20 DIAGNOSIS — I248 Other forms of acute ischemic heart disease: Secondary | ICD-10-CM

## 2017-01-20 DIAGNOSIS — I5033 Acute on chronic diastolic (congestive) heart failure: Secondary | ICD-10-CM

## 2017-01-20 DIAGNOSIS — J9602 Acute respiratory failure with hypercapnia: Secondary | ICD-10-CM

## 2017-01-20 LAB — COMPREHENSIVE METABOLIC PANEL
ALBUMIN: 3.9 g/dL (ref 3.5–5.0)
ALT: 45 U/L (ref 14–54)
ANION GAP: 10 (ref 5–15)
AST: 35 U/L (ref 15–41)
Alkaline Phosphatase: 78 U/L (ref 38–126)
BILIRUBIN TOTAL: 1.1 mg/dL (ref 0.3–1.2)
BUN: 41 mg/dL — ABNORMAL HIGH (ref 6–20)
CALCIUM: 8.7 mg/dL — AB (ref 8.9–10.3)
CO2: 29 mmol/L (ref 22–32)
CREATININE: 1.42 mg/dL — AB (ref 0.44–1.00)
Chloride: 102 mmol/L (ref 101–111)
GFR calc non Af Amer: 34 mL/min — ABNORMAL LOW (ref >60)
GFR, EST AFRICAN AMERICAN: 39 mL/min — AB (ref >60)
GLUCOSE: 119 mg/dL — AB (ref 65–99)
POTASSIUM: 4.3 mmol/L (ref 3.5–5.1)
Sodium: 141 mmol/L (ref 135–145)
Total Protein: 7.5 g/dL (ref 6.5–8.1)

## 2017-01-20 LAB — CBC
HEMATOCRIT: 44.9 % (ref 35.0–47.0)
Hemoglobin: 14.5 g/dL (ref 12.0–16.0)
MCH: 29.7 pg (ref 26.0–34.0)
MCHC: 32.4 g/dL (ref 32.0–36.0)
MCV: 91.5 fL (ref 80.0–100.0)
Platelets: 201 10*3/uL (ref 150–440)
RBC: 4.91 MIL/uL (ref 3.80–5.20)
RDW: 16.7 % — ABNORMAL HIGH (ref 11.5–14.5)
WBC: 11.8 10*3/uL — ABNORMAL HIGH (ref 3.6–11.0)

## 2017-01-20 LAB — BODY FLUID CELL COUNT WITH DIFFERENTIAL
EOS FL: 0 %
Lymphs, Fluid: 80 %
Monocyte-Macrophage-Serous Fluid: 13 %
NEUTROPHIL FLUID: 7 %
WBC FLUID: 517 uL

## 2017-01-20 LAB — APTT
APTT: 58 s — AB (ref 24–36)
aPTT: >160 seconds (ref 24–36)
aPTT: 136 seconds — ABNORMAL HIGH (ref 24–36)

## 2017-01-20 LAB — LACTATE DEHYDROGENASE, PLEURAL OR PERITONEAL FLUID: LD, Fluid: 51 U/L — ABNORMAL HIGH (ref 3–23)

## 2017-01-20 LAB — ECHOCARDIOGRAM COMPLETE
HEIGHTINCHES: 65 in
Weight: 2670.21 oz

## 2017-01-20 LAB — HEPARIN LEVEL (UNFRACTIONATED): Heparin Unfractionated: >3.6 IU/mL — ABNORMAL HIGH (ref 0.30–0.70)

## 2017-01-20 LAB — HEMOGLOBIN A1C
HEMOGLOBIN A1C: 6 % — AB (ref 4.8–5.6)
MEAN PLASMA GLUCOSE: 125.5 mg/dL

## 2017-01-20 LAB — PROTEIN, PLEURAL OR PERITONEAL FLUID

## 2017-01-20 MED ORDER — HEPARIN (PORCINE) IN NACL 100-0.45 UNIT/ML-% IJ SOLN
900.0000 [IU]/h | INTRAMUSCULAR | Status: DC
  Administered 2017-01-20: 800 [IU]/h via INTRAVENOUS
  Administered 2017-01-21: 700 [IU]/h via INTRAVENOUS
  Administered 2017-01-22: 900 [IU]/h via INTRAVENOUS
  Filled 2017-01-20 (×2): qty 250

## 2017-01-20 MED ORDER — MORPHINE SULFATE (PF) 2 MG/ML IV SOLN: 1.0000 mg | INTRAVENOUS | Status: DC | PRN

## 2017-01-20 MED ORDER — FUROSEMIDE 10 MG/ML IJ SOLN
80.0000 mg | Freq: Four times a day (QID) | INTRAMUSCULAR | Status: AC
  Administered 2017-01-20 (×2): 80 mg via INTRAVENOUS
  Filled 2017-01-20 (×2): qty 8

## 2017-01-20 MED ORDER — FUROSEMIDE 10 MG/ML IJ SOLN: 80.0000 mg | Freq: Four times a day (QID) | INTRAMUSCULAR | Status: DC

## 2017-01-20 NOTE — Progress Notes (Signed)
PULMONARY / CRITICAL CARE MEDICINE   Name: Julie Tate MRN: [id number] DOB: [birth year]    ADMISSION DATE:  Jan 29, 2017   CONSULTATION DATE:  01/19/2017  PT PROFILE: 42 F with remote history of sarcoidosis initially adm to med-surg with acute hypoxemic respiratory failure and transferred to ICU/SDU 09/17 for NIPPV   SUBJECTIVE:  Remains dependent on BiPAP. Cognition appears intact. Follows commands.  VITAL SIGNS: BP 118/75   Pulse 87   Temp (!) 97.5 F (36.4 C) (Axillary)   Resp (!) 25   Ht  (1.651 m)   Wt 78.5 kg (173 lb 1 oz)   LMP  (LMP Unknown)   SpO2 95%   BMI 28.80 kg/m   HEMODYNAMICS:    VENTILATOR SETTINGS: FiO2 (%):  [60 %] 60 %  INTAKE / OUTPUT: I/O last 3 completed shifts: In: 2384 [I.V.:2384] Out: 1200 [Urine:1200]  PHYSICAL EXAMINATION:  Gen: Frail, on BiPAP HEENT: NCAT, sclerae white Neck: No LAN, JVP cannot be assessed Lungs: Diminished BS, bronchial breath sounds in right lower lobe, bibasilar crackles, no wheezes  Cardiovascular: IRIR, rate controlled, no M noted Abdomen: Soft, NT, +BS Ext: Symmetric pretibial edema Neuro: No focal deficits, diffusely weak   LABS:  BMET  Recent Labs Lab 29-Jan-2017 1102 01/19/17 0021 01/20/17 0246  NA 139 141 141  K 4.6 3.6 4.3  CL 100* 102 102  CO2 28 33* 29  BUN 28* 26* 41*  CREATININE 0.94 0.88 1.42*  GLUCOSE 125* 117* 119*    Electrolytes  Recent Labs Lab 01/29/17 1102 01/19/17 0021 01/19/17 0632 01/20/17 0246  CALCIUM 9.4 8.9  --  8.7*  MG  --   --  2.1  --   PHOS  --   --  5.4*  --     CBC  Recent Labs Lab 01-29-2017 1102 01/19/17 0021 01/20/17 0246  WBC 9.6 7.5 11.8*  HGB 15.3 13.7 14.5  HCT 45.6 41.5 44.9  PLT 186 158 201    Coag's  Recent Labs Lab 01/19/17 1810 01/20/17 0246 01/20/17 1230  APTT 37* >160* 136*  INR 3.04  --   --     Sepsis Markers  Recent Labs Lab 01/19/17 0437  LATICACIDVEN 1.3    ABG  Recent Labs Lab 01/19/17 0500   PHART 7.20*  PCO2ART 88*  PO2ART 94    Liver Enzymes  Recent Labs Lab 29-Jan-2017 1102 01/20/17 0246  Tate 27 35  ALT 17 45  ALKPHOS 88 78  BILITOT 1.1 1.1  ALBUMIN 4.1 3.9    Cardiac Enzymes  Recent Labs Lab 01/19/17 0632 01/19/17 1220 01/19/17 1810  TROPONINI 0.03* 0.04* 0.06*    Glucose  Recent Labs Lab 01/19/17 0245 01/19/17 0321  GLUCAP 136* 133*    CXR: Cardiomegaly, edema pattern, R > L effusions  STUDIES:  Echocardiogram 09/17: LVEF normal, mild LVH, severe PAH    ASSESSMENT  Acute hypoxemic/hypercarbic respiratory failure. Bilateral pleural effusion CXR c/w pulmonary edema but Echocardiogram not supportive of this dx Severe PAH Hypotension AKI Atrial fibrillation with controlled ventricular rate. DNR/DNI  PLAN Continuous BiPAP as needed Continue diuresis as tolerated Right thoracentesis requested Continue phenylephrine to maintain MAP > 60 mmHg Discussed with Cardiology  Discussed with palliative care medicine Husband updated at bedside  Billy Fischer, MD PCCM service Mobile [phone] Pager [phone] 01/20/2017 1:56 PM

## 2017-01-20 NOTE — Progress Notes (Signed)
Daily Progress Note   Patient Name: Julie Tate       Date: 01/20/2017 DOB: [birth year]  Age: 82 y.o. MRN#: [id number] Attending Physician: Katha Hamming, MD Primary Care Physician: Steele Sizer, MD Admit Date: 01/25/2017  Reason for Consultation/Follow-up: Establishing goals of care  Subjective: Spoke with patient and family at bedside.  Patient is awake and alert, very aware of conversations.  She complained of her mouth being dry.  I spoke with her husband and son outside of the room.  I explained that she has heart failure with pulmonary hypertension and because of that she is at high risk for continued fluid build up in her lungs and continued problems breathing.   I explained that while the thoracentesis she had done today may make her feel better for a day or two - we expect the fluid to come back very quickly.    The family was saddened by this news, but felt they wanted to do the best possible for her and treat what can be treated.  I asked them if she were to become uncomfortable with her breathing - would they want her to receive medications for comfort - even if these medications made her sleepy.  The family does not want her to be uncomfortable and accepted the idea of comfort medications PRN.   Assessment: 82 yo female with acute respiratory failure likely do to pulmonary arterial hypertension and afib.   In ICU On bipap.     Patient Profile/HPI:  82 y.o. female  with past medical history of sarcoid, CKD III, CHF (EF 45) Afib, Chronic pleural effusion and an adrenal mass who was admitted on 01/21/2017 with respiratory failure secondary to CHF. She was being treated with bipap.  Over night (early morning 9/17) a code blue was called.  The patient retained a pulse but  she was hypotensive, hypothermic, and her pulse rate was in the 20s.  On my exam this morning she is on pressors and minimally responsive.  She requires Bipap.    Length of Stay: 2  Current Medications: Scheduled Meds:  . chlorhexidine  15 mL Mouth Rinse BID  . furosemide  80 mg Intravenous Q6H  . mouth rinse  15 mL Mouth Rinse q12n4p    Continuous Infusions: . heparin 800 Units/hr (01/20/17 0800)  .  phenylephrine (NEO-SYNEPHRINE) Adult infusion 60 mcg/min (01/20/17 1248)    PRN Meds: docusate sodium, ipratropium-albuterol  Physical Exam        Thin frail female on bipap.  Alert, appears attentive and engaged in the conversation despite being on bipap CV rrr resp no distress on bipap Abdomen soft, thin, nt Lower Ext.  Decreasing edema - still 2+  Vital Signs: BP 118/75   Pulse 87   Temp (!) 97.5 F (36.4 C) (Axillary)   Resp (!) 25   Ht  (1.651 m)   Wt 78.5 kg (173 lb 1 oz)   LMP  (LMP Unknown)   SpO2 95%   BMI 28.80 kg/m  SpO2: SpO2: 95 % O2 Device: O2 Device: Bi-PAP O2 Flow Rate: O2 Flow Rate (L/min): 40 L/min  Intake/output summary:  Intake/Output Summary (Last 24 hours) at 01/20/17 1510 Last data filed at 01/20/17 0800  Gross per 24 hour  Intake          2562.17 ml  Output              330 ml  Net          2232.17 ml   LBM: Last BM Date: 02/04/2017 Baseline Weight: Weight: 75.8 kg (167 lb 1.7 oz) Most recent weight: Weight: 78.5 kg (173 lb 1 oz)       Palliative Assessment/Data:    Flowsheet Rows     Most Recent Value  Intake Tab  Referral Department  Critical care  Unit at Time of Referral  ICU  Palliative Care Primary Diagnosis  Cardiac  Date Notified  01/19/17  Palliative Care Type  New Palliative care  Reason for referral  Clarify Goals of Care  Date of Admission  02/04/2017  Date first seen by Palliative Care  01/19/17  # of days Palliative referral response time  0 Day(s)  # of days IP prior to Palliative referral  1  Clinical  Assessment  Psychosocial & Spiritual Assessment  Palliative Care Outcomes      Patient Active Problem List   Diagnosis Date Noted  . Pleural effusion   . Acute and chronic respiratory failure with hypoxia (HCC)   . Palliative care encounter   . Goals of care, counseling/discussion   . Acute diastolic CHF (congestive heart failure) (HCC) 04-Feb-2017  . Depression, major, single episode, moderate (HCC) 12/01/2016  . Leg edema, left 05/31/2015  . Proteinuria 11/15/2014  . Benign hypertensive renal disease 11/15/2014  . Asthma 11/15/2014  . Osteopenia 11/15/2014  . Atrial fibrillation (HCC) 11/15/2014  . CHF (congestive heart failure) (HCC) 11/15/2014  . CKD (chronic kidney disease), stage III 11/15/2014  . Hypertensive heart and renal disease 11/15/2014    Palliative Care Plan    Recommendations/Plan:  Will follow with you to see how the patient progresses and continue goals of care conversations.  Goals of Care and Additional Recommendations:  Limitations on Scope of Treatment: Full Scope Treatment  Code Status:  DNR  Prognosis:  Poor prognosis secondary to severe PAH.  Discharge Planning:  To Be Determined  Care plan was discussed with CCM team and family.  Thank you for allowing the Palliative Medicine Team to assist in the care of this patient.  Total time spent:  35 min.     Greater than 50%  of this time was spent counseling and coordinating care related to the above assessment and plan.  Norvel Richards, PA-C Palliative Medicine  Please contact Palliative MedicineTeam phone at [phone] for questions  and concerns between 7 am - 7 pm.   Please see AMION for individual provider pager numbers.

## 2017-01-20 NOTE — Progress Notes (Signed)
[facility] - [place] [facility]   PATIENT NAME: Julie Tate    MR#:  [id number]  DATE OF BIRTH:  [birth year]  SUBJECTIVE: Admitted for acute on chronic systolic heart failure, required BiPAP support, pressors due to hypotension. Today she feels better, breathing better. Still requiring Levophed. End is alert, awake, denies any complaints.   CHIEF COMPLAINT:   Chief Complaint  Patient presents with  . Shortness of Breath    REVIEW OF SYSTEMS:    Review of Systems  Constitutional: Negative for chills and fever.  HENT: Negative for hearing loss.   Eyes: Negative for blurred vision, double vision and photophobia.  Respiratory: Negative for cough, hemoptysis and shortness of breath.   Cardiovascular: Negative for palpitations, orthopnea and leg swelling.  Gastrointestinal: Negative for abdominal pain, diarrhea and vomiting.  Genitourinary: Negative for dysuria and urgency.  Musculoskeletal: Negative for myalgias and neck pain.  Skin: Negative for rash.  Neurological: Negative for dizziness, focal weakness, seizures, weakness and headaches.  Psychiatric/Behavioral: Negative for memory loss. The patient does not have insomnia.     Nutrition: Tolerating Diet: Tolerating PT:      DRUG ALLERGIES:   Allergies  Allergen Reactions  . Entex Lq [Phenylephrine-Guaifenesin]   . Norvasc [Amlodipine Besylate]     VITALS:  Blood pressure 118/75, pulse 87, temperature (!) 97.5 F (36.4 C), temperature source Axillary, resp. rate (!) 25, height  (1.651 m), weight 78.5 kg (173 lb 1 oz), SpO2 95 %.  PHYSICAL EXAMINATION:   Physical Exam  GENERAL:  82 y.o.-year-old patient lying in the bed with no acute distress.  EYES: Pupils equal, round, reactive to light and accommodation. No scleral icterus. Extraocular muscles intact.  HEENT: Head atraumatic, normocephalic. Oropharynx and nasopharynx clear.  NECK:  Supple, no jugular venous distention. No  thyroid enlargement, no tenderness.  LUNGS: Normal breath sounds bilaterally, no wheezing, rales,rhonchi or crepitation. No use of accessory muscles of respiration.  CARDIOVASCULAR: S1, S2 normal. No murmurs, rubs, or gallops.  ABDOMEN: Soft, nontender, nondistended. Bowel sounds present. No organomegaly or mass.  EXTREMITIES: No pedal edema, cyanosis, or clubbing.  NEUROLOGIC: Cranial nerves II through XII are intact. Muscle strength 5/5 in all extremities. Sensation intact. Gait not checked.  PSYCHIATRIC: The patient is alert and oriented x 3.  SKIN: No obvious rash, lesion, or ulcer.    LABORATORY PANEL:   CBC  Recent Labs Lab 01/20/17 0246  WBC 11.8*  HGB 14.5  HCT 44.9  PLT 201   ------------------------------------------------------------------------------------------------------------------  Chemistries   Recent Labs Lab 01/19/17 0632 01/20/17 0246  NA  --  141  K  --  4.3  CL  --  102  CO2  --  29  GLUCOSE  --  119*  BUN  --  41*  CREATININE  --  1.42*  CALCIUM  --  8.7*  MG 2.1  --   AST  --  35  ALT  --  45  ALKPHOS  --  78  BILITOT  --  1.1   ------------------------------------------------------------------------------------------------------------------  Cardiac Enzymes  Recent Labs Lab 01/19/17 1810  TROPONINI 0.06*   ------------------------------------------------------------------------------------------------------------------  RADIOLOGY:  Dg Chest Port 1 View  Result Date: 01/20/2017 CLINICAL DATA:  Status post left thoracentesis EXAM: PORTABLE CHEST 1 VIEW COMPARISON:  01/20/2017 FINDINGS: Reduction in left pleural effusion is noted. No pneumothorax is noted. Cardiac shadow remains enlarged. Large right-sided pleural effusion remains as well. Some patchy changes are seen in the right lung stable from  the prior exam. Changes of congestive failure are again noted as well. IMPRESSION: No pneumothorax following thoracentesis on the left.  Changes of CHF and large right pleural effusion. Electronically Signed   By: Alcide Clever M.D.   On: 01/20/2017 13:44   Dg Chest Port 1 View  Result Date: 01/20/2017 CLINICAL DATA:  Respiratory failure. EXAM: PORTABLE CHEST 1 VIEW COMPARISON:  01/19/2017. FINDINGS: Cardiomegaly with diffuse bilateral pulmonary infiltrates consistent pulmonary edema. Bilateral pleural effusions again noted. Pleural effusions have worsened from prior exam . Left costophrenic angle not completely imaged. Findings most consistent CHF. IMPRESSION: Persistent findings of congestive heart failure with bilateral pulmonary edema and bilateral pleural effusions. Pleural effusions have worsened from prior exam. Electronically Signed   By: Maisie Fus  Register   On: 01/20/2017 06:20   Dg Chest Port 1 View  Result Date: 01/19/2017 CLINICAL DATA:  Acute respiratory failure EXAM: PORTABLE CHEST 1 VIEW COMPARISON:  01/28/2017 FINDINGS: Cardiomegaly. Diffuse bilateral airspace disease noted, right greater than left, slightly increased since prior study. Bilateral effusions, also greater on the right, stable since prior study. IMPRESSION: Diffuse bilateral airspace disease slightly worsened since prior study. Small effusions. Electronically Signed   By: Charlett Nose M.D.   On: 01/19/2017 07:45   US Thoracentesis Asp Pleural Space W/img Guide  Result Date: 01/20/2017 INDICATION: Congestive heart failure. Respiratory failure requiring BiPAP. Left pleural effusion. Request for diagnostic and therapeutic thoracentesis. EXAM: ULTRASOUND GUIDED LEFT THORACENTESIS MEDICATIONS: 1% Lidocaine = 10 mL COMPLICATIONS: None immediate. PROCEDURE: An ultrasound guided thoracentesis was thoroughly discussed with the patient and questions answered. The benefits, risks, alternatives and complications were also discussed. The patient understands and wishes to proceed with the procedure. Written consent was obtained. Ultrasound was performed to localize and mark  an adequate pocket of fluid in the left chest. The area was then prepped and draped in the normal sterile fashion. 1% Lidocaine was used for local anesthesia. Under ultrasound guidance a 6 Fr Safe-T-Centesis catheter was introduced. Thoracentesis was performed. The catheter was removed and a dressing applied. FINDINGS: A total of approximately 800 mL of clear amber fluid was removed. Samples were sent to the laboratory as requested by the clinical team. IMPRESSION: Successful ultrasound guided left thoracentesis yielding 800 mL of pleural fluid. Chest X-ray pending. Read by:  Corrin Parker, PA-C Electronically Signed   By: Alcide Clever M.D.   On: 01/20/2017 13:18     ASSESSMENT AND PLAN:   Principal Problem:   Acute diastolic CHF (congestive heart failure) (HCC) Active Problems:   Acute and chronic respiratory failure with hypoxia (HCC)   Palliative care encounter   Goals of care, counseling/discussion   Pleural effusion   1 acute respiratory failure with hypoxia and hypercarbia secondary to acute on chronic diastolic heart failure: Admitted to intensive care unit for BiPAP support, continue IV Lasix, blood pressure permits. As  being off pressors as tolerated to keep map 65 Acute respiratory failure is multifactorial secondary to severe pulmonary hypertension, pleural effusion, acute on chronic diastolic heart failure, COPD exacerbation.  Status post right thoracocentesis today, 800 mL of clear fluid removed.  #2 hypotension: Unable to give Coreg because of hypotension  3, elevated troponin secondary to demand ischemia: On heparin drip at this time. Plan to start to xarelto medical condition is stabilized.   #4 CODE STATUS DO NOT RESUSCITATE, consult palliative care. Discussed with husband at bedside.  #5 acute kidney injury likely secondary to diuretics: Monitor kidney function closely.  #5 history of depression:  reStart home medicines after removing the BiPAP.   All the records are  reviewed and case discussed with Care Management/Social Workerr. Management plans discussed with the patient, family and they are in agreement.  CODE STATUS: DO NOT RESUSCITATE   TOTAL TIME TAKING CARE OF THIS PATIENT:35 minutes.   POSSIBLE D/C IN 3-4 DAYS, DEPENDING ON CLINICAL CONDITION.   Katha Hamming M.D on 01/20/2017 at 2:01 PM  Between 7am to 6pm - Pager - [phone]  After 6pm go to [email or web address] - password [email or web address] [facility]  [facility]  Office  [phone]  CC: Primary care physician; Steele Sizer, MD

## 2017-01-20 NOTE — Progress Notes (Signed)
Patient Name: Julie Tate Date of Encounter: 01/20/2017  Primary Cardiologist: New to [facility] - consult by [facility] Problem List     Principal Problem:   Acute diastolic CHF (congestive heart failure) (HCC) Active Problems:   Acute respiratory failure [facility])   Palliative care encounter   Goals of care, counseling/discussion     Subjective   More alert this morning. Echo showed preserved EF with right-sided pressure at least 85-90 mmHg. CXR this morning showed worsening pleural effusions. Planning for thoracentesis today per PCCM. Remains on BiPAP. Net + 1.6 L for the admission. Remains on phenylephrine for MAP > 65 mmHg. Heart rate controlled, in Afib. Renal function worse (0.88-->1.42).   Inpatient Medications    Scheduled Meds: . chlorhexidine  15 mL Mouth Rinse BID  . furosemide  80 mg Intravenous Q6H  . mouth rinse  15 mL Mouth Rinse q12n4p   Continuous Infusions: . heparin 800 Units/hr (01/20/17 0800)  . phenylephrine (NEO-SYNEPHRINE) Adult infusion 60 mcg/min (01/20/17 0924)   PRN Meds: docusate sodium, ipratropium-albuterol   Vital Signs    Vitals:   01/20/17 0400 01/20/17 0420 01/20/17 0600 01/20/17 0800  BP: (!) 128/57  124/63 114/70  Pulse: 89  95 83  Resp: Temp: 98.1 F (36.7 C)   (!) 97.5 F (36.4 C)  TempSrc: Axillary     SpO2: 92%  93% 94%  Weight:  173 lb 1 oz (78.5 kg)    Height:        Intake/Output Summary (Last 24 hours) at 01/20/17 1100 Last data filed at 01/20/17 0800  Gross per 24 hour  Intake          2605.93 ml  Output              330 ml  Net          2275.93 ml   Filed Weights   Jan 31, 2017 1705 01/19/17 0300 01/20/17 0420  Weight: 168 lb 6.4 oz (76.4 kg) 166 lb 14.2 oz (75.7 kg) 173 lb 1 oz (78.5 kg)    Physical Exam    GEN: Ill and frail appearing, more alert, in no acute distress.  HEENT: Grossly normal.  Neck: Supple, no JVD, carotid bruits, or masses. Cardiac: Irregularly irregular, no murmurs,  rubs, or gallops. No clubbing, cyanosis, edema.  Radials/DP/PT 2+ and equal bilaterally.  Respiratory:  Diminished breath sounds bilaterally. GI: Soft, nontender, nondistended, BS + x 4. MS: no deformity or atrophy. Skin: warm and dry, no rash. Neuro:  Strength and sensation are intact. Psych: Alert.  Normal affect.  Labs    CBC  Recent Labs  January 31, 2017 1102 01/19/17 0021 01/20/17 0246  WBC 9.6 7.5 11.8*  NEUTROABS 8.3*  --   --   HGB 15.3 13.7 14.5  HCT 45.6 41.5 44.9  MCV 89.7 91.4 91.5  PLT 186 158 201   Basic Metabolic Panel  Recent Labs  01/19/17 0021 01/19/17 0632 01/20/17 0246  NA 141  --  141  K 3.6  --  4.3  CL 102  --  102  CO2 33*  --  29  GLUCOSE 117*  --  119*  BUN 26*  --  41*  CREATININE 0.88  --  1.42*  CALCIUM 8.9  --  8.7*  MG  --  2.1  --   PHOS  --  5.4*  --    Liver Function Tests  Recent Labs  31-Jan-2017 1102 01/20/17 0246  AST 27 35  ALT 17 45  ALKPHOS 88 78  BILITOT 1.1 1.1  PROT 7.9 7.5  ALBUMIN 4.1 3.9   No results for input(s): LIPASE, AMYLASE in the last 72 hours. Cardiac Enzymes  Recent Labs  01/19/17 0632 01/19/17 1220 01/19/17 1810  TROPONINI 0.03* 0.04* 0.06*   BNP Invalid input(s): POCBNP D-Dimer No results for input(s): DDIMER in the last 72 hours. Hemoglobin A1C No results for input(s): HGBA1C in the last 72 hours. Fasting Lipid Panel No results for input(s): CHOL, HDL, LDLCALC, TRIG, CHOLHDL, LDLDIRECT in the last 72 hours. Thyroid Function Tests No results for input(s): TSH, T4TOTAL, T3FREE, THYROIDAB in the last 72 hours.  Invalid input(s): FREET3  Telemetry    Afib, 80s bpm - Personally Reviewed  ECG    n/a - Personally Reviewed  Radiology    Dg Chest Port 1 View  Result Date: 01/20/2017 IMPRESSION: Persistent findings of congestive heart failure with bilateral pulmonary edema and bilateral pleural effusions. Pleural effusions have worsened from prior exam. Electronically Signed   By: Maisie Fus   Register   On: 01/20/2017 06:20   Dg Chest Port 1 View  Result Date: 01/19/2017 IMPRESSION: Diffuse bilateral airspace disease slightly worsened since prior study. Small effusions. Electronically Signed   By: Charlett Nose M.D.   On: 01/19/2017 07:45   Dg Chest Portable 1 View  Result Date: 01/12/2017 IMPRESSION: Moderate congestive heart failure. Right greater than left pleural effusions with adjacent airspace disease. This is most likely atelectasis. Concurrent infection or aspiration cannot be excluded. Aortic Atherosclerosis (ICD10-I70.0). Electronically Signed   By: Jeronimo Greaves M.D.   On: 01/17/2017 11:22    Cardiac Studies   TTE 01/19/2017: Study Conclusions  - Left ventricle: The cavity size was normal. Wall thickness was   increased in a pattern of mild LVH. Systolic function was   vigorous. The estimated ejection fraction was in the range of 65%   to 70%. Regional wall motion abnormalities cannot be excluded. - Mitral valve: Moderately thickened leaflets . There was mild   regurgitation. - Right ventricle: The cavity size was moderately dilated. Wall   thickness was mildly increased. Systolic function was normal. - Right atrium: The atrium was mildly dilated. - Pulmonary arteries: Systolic pressure was severely increased. PA   systolic pressure is at least 16-10 mmHg. - Pericardium, extracardiac: A trivial pericardial effusion was   identified.  Patient Profile     82 y.o. female with history of chronic combined CHF with prior EF 45% per documentation now with normalized EF by TTE 01/2017, chronic Afib previously on Pradaxa now on Xarelto, sarcoidosis, HTN, asthma, and anxiety/depression who has been noting increased SOB and lower extremity swelling the past 6-12 months, worse the past two days leading to her admission.   Assessment & Plan    1. Acute respiratory failure with hypoxia and hypercarbia: -Requiring BiPAP, wean as able per PCCM -Likely multifactorial  including severe pulmonary hypertension, acute on chronic diastolic CHF, pleural effusions, and AECOPD -For thoracentesis today  2. Acute on chronic diastolic CHF/pulmonary HTN: -Renal function worsened with diuresis, IV Lasix 80 mg ordered by PCCM -Phenylephrine for MAP > 65 mmHg per PCCM, wean as able  -Consider discontinuing nitropaste -Hold Coreg given need for pressor support currently -Restart evidence based heart failure regimen as BP allows -TTE as above  3. Elevated troponin: -Likely supply demand ischemia -On heparin gtt in place of Xarelto, continue for now given need for BiPAP -TTE as above  4.  Chronic Afib: -Rate controlled -Hold Coreg as above given hypotension -Continue heparin gtt in place of Xarelto as above for uninterrupted anticoagulation  -CHADS2VASc at least 5 (CHF, HTN, age x 2, female)  5. AECOPD: -Per IM  6. Hyperglycemia: -Check HGB A1c  7. Dispo: -Guarded prognosis -Consider Palliative care  Signed, Carola Frost [facility] Pager: [phone] 01/20/2017, 11:00 AM

## 2017-01-20 NOTE — Progress Notes (Addendum)
ANTICOAGULATION CONSULT NOTE  Pharmacy Consult for Heparin Indication: atrial fibrillation  Allergies  Allergen Reactions  . Entex Lq [Phenylephrine-Guaifenesin]   . Norvasc [Amlodipine Besylate]     Patient Measurements: Height:  (165.1 cm) Weight: 173 lb 1 oz (78.5 kg) IBW/kg (Calculated) : 57 Heparin Dosing Weight: 72.6 kg  Vital Signs: Temp: 97.5 F (36.4 C) (09/18 0800) Temp Source: Axillary (09/18 0800) BP: 118/75 (09/18 1228) Pulse Rate: 87 (09/18 1100)  Labs:  Recent Labs  01/22/2017 1102  01/19/17 0021 01/19/17 0632 01/19/17 1220 01/19/17 1810 01/20/17 0246 01/20/17 1230  HGB 15.3  --  13.7  --   --   --  14.5  --   HCT 45.6  --  41.5  --   --   --  44.9  --   PLT 186  --  158  --   --   --  201  --   APTT  --   --   --   --   --  37* >160* 136*  LABPROT  --   --   --   --   --  31.2*  --   --   INR  --   --   --   --   --  3.04  --   --   HEPARINUNFRC  --   --   --   --   --  >3.60* >3.60*  --   CREATININE 0.94  --  0.88  --   --   --  1.42*  --   TROPONINI <0.03  < > <0.03 0.03* 0.04* 0.06*  --   --   < > = values in this interval not displayed.  Estimated Creatinine Clearance: 32.7 mL/min (A) (by C-G formula based on SCr of 1.42 mg/dL (H)).   Medical History: Past Medical History:  Diagnosis Date  . Adrenal mass (HCC)   . Asthma   . Chronic atrial fibrillation (HCC)    a. CHADS2VASc => 5 (CHF, HTN, age x 2, female); b. on Xarelto  . Chronic systolic CHF (congestive heart failure) (HCC)    a. reported EF 45% in ~ 2015  . Hypertension   . Osteopenia   . Proteinuria   . Sarcoidosis   . Vitamin D deficiency     Assessment: 82 y/o F with a h/o atrial fibrillation on rivaroxaban to transition to heparin due to elevated troponin and worsening EF. Last dose of rivaroxaban was 1700 9/16. Patient currently receiving heparin at 800 units/hr.   Goal of Therapy:  APTT 68-102 Heparin level 0.3-0.7 units/ml Monitor platelets by anticoagulation  protocol: Yes   Plan:  Will decrease rate to 600 units/hr. Will recheck aPTT at 2130.   Pharmacy will continue to monitor and adjust per consult.   Simpson,Michael L 01/20/2017,3:28 PM    09/18 2200 aPTT 58. Increase rate to 700 units/hr and recheck in 6 hours.  0919 AM aPTT 58, heparin level 3.22. Increase rate to 800 units/hr and recheck in 6 hours.  Fulton Reek, PharmD, BCPS  01/20/17 10:59 PM

## 2017-01-20 NOTE — Procedures (Signed)
PROCEDURE SUMMARY:  Successful US guided left thoracentesis. Yielded 800 mL of clear yellow fluid. Pt tolerated procedure well. No immediate complications.  Specimen ws sent for labs.  Post procedure chest X-ray is pending.  Sarabelle Genson S Carden Teel PA-C 01/20/2017 12:27 PM

## 2017-01-20 NOTE — Progress Notes (Signed)
CRITICAL VALUE ALERT  Critical Value:  PTT >160, Heparin >3.60  Date & Time Notied:  01/20/17 @ 0430  Provider Notified: NP Annabelle Harman   Orders Received/Actions taken: Contact Pharmacy and was informed to stop heparin and he will input orders in to start it back up.

## 2017-01-21 ENCOUNTER — Inpatient Hospital Stay: Payer: Medicare Other

## 2017-01-21 DIAGNOSIS — N179 Acute kidney failure, unspecified: Secondary | ICD-10-CM

## 2017-01-21 LAB — CBC
HCT: 40.8 % (ref 35.0–47.0)
HEMOGLOBIN: 13.5 g/dL (ref 12.0–16.0)
MCH: 30.5 pg (ref 26.0–34.0)
MCHC: 33 g/dL (ref 32.0–36.0)
MCV: 92.4 fL (ref 80.0–100.0)
PLATELETS: 143 10*3/uL — AB (ref 150–440)
RBC: 4.42 MIL/uL (ref 3.80–5.20)
RDW: 17 % — ABNORMAL HIGH (ref 11.5–14.5)
WBC: 8 10*3/uL (ref 3.6–11.0)

## 2017-01-21 LAB — BASIC METABOLIC PANEL
Anion gap: 7 (ref 5–15)
BUN: 45 mg/dL — ABNORMAL HIGH (ref 6–20)
CALCIUM: 8.6 mg/dL — AB (ref 8.9–10.3)
CO2: 33 mmol/L — ABNORMAL HIGH (ref 22–32)
CREATININE: 1.36 mg/dL — AB (ref 0.44–1.00)
Chloride: 105 mmol/L (ref 101–111)
GFR calc non Af Amer: 36 mL/min — ABNORMAL LOW (ref >60)
GFR, EST AFRICAN AMERICAN: 41 mL/min — AB (ref >60)
Glucose, Bld: 99 mg/dL (ref 65–99)
Potassium: 3.8 mmol/L (ref 3.5–5.1)
SODIUM: 145 mmol/L (ref 135–145)

## 2017-01-21 LAB — HEPARIN LEVEL (UNFRACTIONATED): Heparin Unfractionated: 3.22 IU/mL — ABNORMAL HIGH (ref 0.30–0.70)

## 2017-01-21 LAB — CYTOLOGY - NON PAP

## 2017-01-21 LAB — MISC LABCORP TEST (SEND OUT): LABCORP TEST CODE: 19588

## 2017-01-21 LAB — APTT
APTT: 97 s — AB (ref 24–36)
APTT: 48 s — AB (ref 24–36)
aPTT: 58 seconds — ABNORMAL HIGH (ref 24–36)

## 2017-01-21 MED ORDER — FUROSEMIDE 10 MG/ML IJ SOLN
80.0000 mg | Freq: Once | INTRAMUSCULAR | Status: AC
  Administered 2017-01-21: 80 mg via INTRAVENOUS
  Filled 2017-01-21: qty 8

## 2017-01-21 MED ORDER — POTASSIUM CHLORIDE CRYS ER 20 MEQ PO TBCR
40.0000 meq | EXTENDED_RELEASE_TABLET | Freq: Once | ORAL | Status: DC
  Filled 2017-01-21: qty 2

## 2017-01-21 NOTE — Progress Notes (Signed)
[facility] [place] [facility]   PATIENT NAME: Julie Tate    MR#:  [id number]  DATE OF BIRTH:  28-Oct-1934  SUBJECTIVE: Admitted for shortness of breath, receiving IV diuretics, after BiPAP, now on Ventimask. Patient says that shortness of breath is improving.   CHIEF COMPLAINT:   Chief Complaint  Patient presents with  . Shortness of Breath    REVIEW OF SYSTEMS:    Review of Systems  Constitutional: Negative for chills and fever.  HENT: Negative for hearing loss.   Eyes: Negative for blurred vision, double vision and photophobia.  Respiratory: Positive for shortness of breath. Negative for cough, hemoptysis, sputum production and wheezing.   Cardiovascular: Negative for palpitations, orthopnea and leg swelling.  Gastrointestinal: Negative for abdominal pain, diarrhea and vomiting.  Genitourinary: Negative for dysuria and urgency.  Musculoskeletal: Negative for myalgias and neck pain.  Skin: Negative for rash.  Neurological: Negative for dizziness, focal weakness, seizures, weakness and headaches.  Psychiatric/Behavioral: Negative for memory loss. The patient does not have insomnia.     Nutrition: Tolerating Diet: Tolerating PT:      DRUG ALLERGIES:   Allergies  Allergen Reactions  . Entex Lq [Phenylephrine-Guaifenesin]   . Norvasc [Amlodipine Besylate]     VITALS:  Blood pressure (!) 116/55, pulse (!) 105, temperature (!) 97.5 F (36.4 C), temperature source Axillary, resp. rate (!) 23, height  (1.651 m), weight 79.8 kg (175 lb 14.8 oz), SpO2 93 %.  PHYSICAL EXAMINATION:   Physical Exam  GENERAL:  82 y.o.-year-old patient lying in the bed with no acute distress.  EYES: Pupils equal, round, reactive to light and accommodation. No scleral icterus. Extraocular muscles intact.  HEENT: Head atraumatic, normocephalic. Oropharynx and nasopharynx clear.  NECK:  Supple, no jugular venous distention. No thyroid enlargement, no  tenderness.  LUNGS: Decreased breath sounds at bases.  CARDIOVASCULAR: S1, S2 normal. No murmurs, rubs, or gallops.  ABDOMEN: Soft, nontender, nondistended. Bowel sounds present. No organomegaly or mass.  EXTREMITIES: No pedal edema, cyanosis, or clubbing.  NEUROLOGIC: Cranial nerves II through XII are intact. Muscle strength 5/5 in all extremities. Sensation intact. Gait not checked.  PSYCHIATRIC: The patient is alert and oriented x 3.  SKIN: No obvious rash, lesion, or ulcer.    LABORATORY PANEL:   CBC  Recent Labs Lab 01/21/17 0509  WBC 8.0  HGB 13.5  HCT 40.8  PLT 143*   ------------------------------------------------------------------------------------------------------------------  Chemistries   Recent Labs Lab 01/19/17 0632 01/20/17 0246 01/21/17 0509  NA  --  141 145  K  --  4.3 3.8  CL  --  102 105  CO2  --  29 33*  GLUCOSE  --  119* 99  BUN  --  41* 45*  CREATININE  --  1.42* 1.36*  CALCIUM  --  8.7* 8.6*  MG 2.1  --   --   AST  --  35  --   ALT  --  45  --   ALKPHOS  --  78  --   BILITOT  --  1.1  --    ------------------------------------------------------------------------------------------------------------------  Cardiac Enzymes  Recent Labs Lab 01/19/17 1810  TROPONINI 0.06*   ------------------------------------------------------------------------------------------------------------------  RADIOLOGY:  Dg Chest Port 1 View  Result Date: 01/21/2017 CLINICAL DATA:  Respiratory failure EXAM: PORTABLE CHEST 1 VIEW COMPARISON:  01/20/2017 FINDINGS: Cardiomegaly with vascular congestion. Moderate to large right pleural effusion with diffuse right lung airspace opacity. Mild interstitial prominence throughout the left lung with  left base atelectasis or infiltrate. No real change since prior study. IMPRESSION: No significant change since prior study. Electronically Signed   By: Charlett Nose M.D.   On: 01/21/2017 08:21   Dg Chest Port 1  View  Result Date: 01/20/2017 CLINICAL DATA:  Status post left thoracentesis EXAM: PORTABLE CHEST 1 VIEW COMPARISON:  01/20/2017 FINDINGS: Reduction in left pleural effusion is noted. No pneumothorax is noted. Cardiac shadow remains enlarged. Large right-sided pleural effusion remains as well. Some patchy changes are seen in the right lung stable from the prior exam. Changes of congestive failure are again noted as well. IMPRESSION: No pneumothorax following thoracentesis on the left. Changes of CHF and large right pleural effusion. Electronically Signed   By: Alcide Clever M.D.   On: 01/20/2017 13:44   Dg Chest Port 1 View  Result Date: 01/20/2017 CLINICAL DATA:  Respiratory failure. EXAM: PORTABLE CHEST 1 VIEW COMPARISON:  01/19/2017. FINDINGS: Cardiomegaly with diffuse bilateral pulmonary infiltrates consistent pulmonary edema. Bilateral pleural effusions again noted. Pleural effusions have worsened from prior exam . Left costophrenic angle not completely imaged. Findings most consistent CHF. IMPRESSION: Persistent findings of congestive heart failure with bilateral pulmonary edema and bilateral pleural effusions. Pleural effusions have worsened from prior exam. Electronically Signed   By: Maisie Fus  Register   On: 01/20/2017 06:20   US Thoracentesis Asp Pleural Space W/img Guide  Result Date: 01/20/2017 INDICATION: Congestive heart failure. Respiratory failure requiring BiPAP. Left pleural effusion. Request for diagnostic and therapeutic thoracentesis. EXAM: ULTRASOUND GUIDED LEFT THORACENTESIS MEDICATIONS: 1% Lidocaine = 10 mL COMPLICATIONS: None immediate. PROCEDURE: An ultrasound guided thoracentesis was thoroughly discussed with the patient and questions answered. The benefits, risks, alternatives and complications were also discussed. The patient understands and wishes to proceed with the procedure. Written consent was obtained. Ultrasound was performed to localize and mark an adequate pocket of fluid  in the left chest. The area was then prepped and draped in the normal sterile fashion. 1% Lidocaine was used for local anesthesia. Under ultrasound guidance a 6 Fr Safe-T-Centesis catheter was introduced. Thoracentesis was performed. The catheter was removed and a dressing applied. FINDINGS: A total of approximately 800 mL of clear amber fluid was removed. Samples were sent to the laboratory as requested by the clinical team. IMPRESSION: Successful ultrasound guided left thoracentesis yielding 800 mL of pleural fluid. Chest X-ray pending. Read by:  Corrin Parker, PA-C Electronically Signed   By: Alcide Clever M.D.   On: 01/20/2017 13:18     ASSESSMENT AND PLAN:   Principal Problem:   Acute diastolic CHF (congestive heart failure) (HCC) Active Problems:   Acute and chronic respiratory failure with hypoxia (HCC)   Palliative care encounter   Goals of care, counseling/discussion   Pleural effusion   1 acute respiratory failure with hypoxia and hypercarbia secondary to acute on chronic diastolic heart failure: Admitted to intensive care unit for BiPAP support,Now off bipap continue IV Lasix, blood pressure permits. As  being off pressors as tolerated to keep map 65 Acute respiratory failure is multifactorial secondary to severe pulmonary hypertension, pleural effusion, acute on chronic diastolic heart failure, COPD exacerbation.  Status post right thoracocentesis today, 800 mL of clear fluid removed. Continue IV Lasix. And monitor kidney function closely.  #2 hypotension: Unable to give Coreg because of hypotension  3, elevated troponin secondary to demand ischemia: On heparin drip at this time. Plan to start to xarelto medical condition is stabilized.   #4 CODE STATUS DO NOT RESUSCITATE, consult palliative  care. Discussed with husband at bedside.   #5 acute kidney injury likely secondary to diuretics: Improved kidney function.  #5 history of depression: reStart home medicines   All the records  are reviewed and case discussed with Care Management/Social Workerr. Management plans discussed with the patient, family and they are in agreement.  CODE STATUS: DO NOT RESUSCITATE   TOTAL TIME TAKING CARE OF THIS PATIENT:35 minutes.   POSSIBLE D/C IN 3-4 DAYS, DEPENDING ON CLINICAL CONDITION.   Katha Hamming M.D on 01/21/2017 at 12:38 PM  Between 7am to 6pm - Pager - [phone]  After 6pm go to [email or web address] - password [email or web address] [facility]  [facility]  Office  [phone]  CC: Primary care physician; Steele Sizer, MD

## 2017-01-21 NOTE — Progress Notes (Addendum)
ANTICOAGULATION CONSULT NOTE  Pharmacy Consult for Heparin Indication: atrial fibrillation  Allergies  Allergen Reactions  . Entex Lq [Phenylephrine-Guaifenesin]   . Norvasc [Amlodipine Besylate]     Patient Measurements: Height:  (165.1 cm) Weight: 175 lb 14.8 oz (79.8 kg) IBW/kg (Calculated) : 57 Heparin Dosing Weight: 72.6 kg  Vital Signs: Temp: 97.5 F (36.4 C) (09/19 1200) Temp Source: Axillary (09/19 1200) BP: 110/58 (09/19 1400) Pulse Rate: 86 (09/19 1400)  Labs:  Recent Labs  01/19/17 0021 01/19/17 9147 01/19/17 1220  01/19/17 1810 01/20/17 0246  01/20/17 2146 01/21/17 0509 01/21/17 1243  HGB 13.7  --   --   --   --  14.5  --   --  13.5  --   HCT 41.5  --   --   --   --  44.9  --   --  40.8  --   PLT 158  --   --   --   --  201  --   --  143*  --   APTT  --   --   --   < > 37* >160*  < > 58* 58* 97*  LABPROT  --   --   --   --  31.2*  --   --   --   --   --   INR  --   --   --   --  3.04  --   --   --   --   --   HEPARINUNFRC  --   --   --   --  >3.60* >3.60*  --   --  3.22*  --   CREATININE 0.88  --   --   --   --  1.42*  --   --  1.36*  --   TROPONINI <0.03 0.03* 0.04*  --  0.06*  --   --   --   --   --   < > = values in this interval not displayed.  Estimated Creatinine Clearance: 34.4 mL/min (A) (by C-G formula based on SCr of 1.36 mg/dL (H)).   Medical History: Past Medical History:  Diagnosis Date  . Adrenal mass (HCC)   . Asthma   . Chronic atrial fibrillation (HCC)    a. CHADS2VASc => 5 (CHF, HTN, age x 2, female); b. on Xarelto  . Chronic systolic CHF (congestive heart failure) (HCC)    a. reported EF 45% in ~ 2015  . Hypertension   . Osteopenia   . Proteinuria   . Sarcoidosis   . Vitamin D deficiency     Assessment: 82 y/o F with a h/o atrial fibrillation on rivaroxaban to transition to heparin due to elevated troponin and worsening EF. Last dose of rivaroxaban was 1700 9/16. Patient currently receiving heparin at 800 units/hr.    Goal of Therapy:  APTT 68-102 Heparin level 0.3-0.7 units/ml Monitor platelets by anticoagulation protocol: Yes   Plan:  Will continue 800 units/hr. Will recheck aPTT at 1900.   Pharmacy will continue to monitor and adjust per consult.   Elizabethanne Lusher L 01/21/2017,4:14 PM

## 2017-01-21 NOTE — Progress Notes (Signed)
PULMONARY / CRITICAL CARE MEDICINE   Name: Julie Tate MRN: [id number] DOB: 02/11/1935    ADMISSION DATE:  02-03-17   CONSULTATION DATE:  01/19/2017  PT PROFILE: 3 F with remote history of sarcoidosis initially adm to med-surg with acute hypoxemic respiratory failure and transferred to ICU/SDU 09/17 for NIPPV  Underwent thoracentesis 09/18: 800 cc transudative fluid removed  SUBJECTIVE:  Comfortable on venturi mask - somnolent but awakens and F/C  VITAL SIGNS: BP (!) 116/55 (BP Location: Left Arm)   Pulse (!) 105   Temp (!) 97.5 F (36.4 C) (Axillary)   Resp (!) 23   Ht  (1.651 m)   Wt 79.8 kg (175 lb 14.8 oz)   LMP  (LMP Unknown)   SpO2 93%   BMI 29.28 kg/m   HEMODYNAMICS:    VENTILATOR SETTINGS: FiO2 (%):  [45 %-60 %] 45 %  INTAKE / OUTPUT: I/O last 3 completed shifts: In: 3745.2 [I.V.:3745.2] Out: 1860 [Urine:1860]  PHYSICAL EXAMINATION:  Gen: Frail, NAD on VM HEENT: NCAT, sclerae white Neck: No LAN, JVP cannot be assessed Lungs: dull with bronchial breath sounds in right base, bibasilar crackles, no wheezes  Cardiovascular: IRIR, rate controlled, no M noted Abdomen: Soft, NT, +BS Ext: 2-3+ Symmetric pretibial edema Neuro: No focal deficits, diffusely weak   LABS:  BMET  Recent Labs Lab 01/19/17 0021 01/20/17 0246 01/21/17 0509  NA 141 141 145  K 3.6 4.3 3.8  CL 102 102 105  CO2 33* 29 33*  BUN 26* 41* 45*  CREATININE 0.88 1.42* 1.36*  GLUCOSE 117* 119* 99    Electrolytes  Recent Labs Lab 01/19/17 0021 01/19/17 0632 01/20/17 0246 01/21/17 0509  CALCIUM 8.9  --  8.7* 8.6*  MG  --  2.1  --   --   PHOS  --  5.4*  --   --     CBC  Recent Labs Lab 01/19/17 0021 01/20/17 0246 01/21/17 0509  WBC 7.5 11.8* 8.0  HGB 13.7 14.5 13.5  HCT 41.5 44.9 40.8  PLT 158 201 143*    Coag's  Recent Labs Lab 01/19/17 1810  01/20/17 1230 01/20/17 2146 01/21/17 0509  APTT 37*  < > 136* 58* 58*  INR 3.04  --   --   --   --    < > = values in this interval not displayed.  Sepsis Markers  Recent Labs Lab 01/19/17 0437  LATICACIDVEN 1.3    ABG  Recent Labs Lab 01/19/17 0500  PHART 7.20*  PCO2ART 88*  PO2ART 94    Liver Enzymes  Recent Labs Lab 2017-02-03 1102 01/20/17 0246  AST 27 35  ALT 17 45  ALKPHOS 88 78  BILITOT 1.1 1.1  ALBUMIN 4.1 3.9    Cardiac Enzymes  Recent Labs Lab 01/19/17 0632 01/19/17 1220 01/19/17 1810  TROPONINI 0.03* 0.04* 0.06*    Glucose  Recent Labs Lab 01/19/17 0245 01/19/17 0321  GLUCAP 136* 133*    CXR: NSC CM, edema pattern, R > L effusions  STUDIES:  Echocardiogram 09/17: LVEF normal, mild LVH, severe PAH    ASSESSMENT  Acute on chronic hypoxemic/hypercarbic respiratory failure. Bilateral pleural effusion - transudates CXR c/w pulmonary edema but echocardiogram without evidence of L sided dysfunction Severe PAH Hypotension - improving AKI, nonoliguric - Cr slightly improved CAF with controlled ventricular rate. DNR/DNI  PLAN Continue supplemental O2 Continuous PRN BiPAP Continue diuresis as tolerated Continue phenylephrine to maintain MAP > 60 mmHg Discussed with Cardiology  Discussed  with palliative care medicine Husband updated at bedside  If does not require BiPAP today, we can consider transfer out of ICU/SDU in AM 09/20  Billy Fischer, MD PCCM service Mobile [phone] Pager [phone] 01/21/2017 12:29 PM

## 2017-01-21 NOTE — Progress Notes (Signed)
Daily Progress Note   Patient Name: Julie Tate       Date: 01/21/2017 DOB: 13-Oct-1934  Age: 82 y.o. MRN#: [id number] Attending Physician: Katha Hamming, MD Primary Care Physician: Steele Sizer, MD Admit Date: 01/14/2017  Reason for Consultation/Follow-up: Establishing goals of care  Subjective: Family at bedside.  Husband accepting.  He is grateful for the care his wife is receiving. Wife is sleeping.  He has no questions today and comments that he is simply taking it day by day.  We agreed to meet on 9/20   Assessment: 82 yo frail female with respiratory failure due to acute on chronic d-heart failure and pulmonary HTN. Now on venti mask (off bipap) but somnolent today.   Patient Profile/HPI:  82 y.o. female  with past medical history of sarcoid, CKD III, CHF (EF 45) Afib, Chronic pleural effusion and an adrenal mass who was admitted on 01/20/2017 with respiratory failure secondary to CHF. She was being treated with bipap.  Over night (early morning 9/17) a code blue was called.  The patient retained a pulse but she was hypotensive, hypothermic, and her pulse rate was in the 20s.  She received thoracentesis on 9/18 with some relief.     Length of Stay: 3  Current Medications: Scheduled Meds:  . chlorhexidine  15 mL Mouth Rinse BID  . mouth rinse  15 mL Mouth Rinse q12n4p  . potassium chloride  40 mEq Oral Once    Continuous Infusions: . heparin 800 Units/hr (01/21/17 1400)  . phenylephrine (NEO-SYNEPHRINE) Adult infusion Stopped (01/21/17 1419)    PRN Meds: docusate sodium, ipratropium-albuterol, morphine injection  Physical Exam        Thin frail female.  Sleeping soundly with the venti mask in place.   Family at bedside.  Vital Signs: BP (!) 110/58   Pulse  86   Temp (!) 97.5 F (36.4 C) (Axillary)   Resp 18   Ht  (1.651 m)   Wt 79.8 kg (175 lb 14.8 oz)   LMP  (LMP Unknown)   SpO2 94%   BMI 29.28 kg/m  SpO2: SpO2: 94 % O2 Device: O2 Device: Venturi Mask O2 Flow Rate: O2 Flow Rate (L/min): 45 L/min  Intake/output summary:  Intake/Output Summary (Last 24 hours) at 01/21/17 1539 Last data filed at 01/21/17 1400  Gross per 24 hour  Intake          1329.64 ml  Output             1755 ml  Net          -425.36 ml   LBM: Last BM Date: 01/20/17 Baseline Weight: Weight: 75.8 kg (167 lb 1.7 oz) Most recent weight: Weight: 79.8 kg (175 lb 14.8 oz)       Palliative Assessment/Data:    Flowsheet Rows     Most Recent Value  Intake Tab  Referral Department  Critical care  Unit at Time of Referral  ICU  Palliative Care Primary Diagnosis  Cardiac  Date Notified  01/19/17  Palliative Care Type  New Palliative care  Reason for referral  Clarify Goals of Care  Date of Admission  01/13/2017  Date first seen by Palliative Care  01/19/17  # of days Palliative referral response time  0 Day(s)  # of days IP prior to Palliative referral  1  Clinical Assessment  Psychosocial & Spiritual Assessment  Palliative Care Outcomes      Patient Active Problem List   Diagnosis Date Noted  . Pleural effusion   . Acute and chronic respiratory failure with hypoxia (HCC)   . Palliative care encounter   . Goals of care, counseling/discussion   . Acute diastolic CHF (congestive heart failure) (HCC) 01/26/2017  . Depression, major, single episode, moderate (HCC) 12/01/2016  . Leg edema, left 05/31/2015  . Proteinuria 11/15/2014  . Benign hypertensive renal disease 11/15/2014  . Asthma 11/15/2014  . Osteopenia 11/15/2014  . Atrial fibrillation (HCC) 11/15/2014  . CHF (congestive heart failure) (HCC) 11/15/2014  . CKD (chronic kidney disease), stage III 11/15/2014  . Hypertensive heart and renal disease 11/15/2014    Palliative Care Plan     Recommendations/Plan:  PMT will follow with you to assist the family in understanding her progression and decision making.  PRN comfort meds available  Goals of Care and Additional Recommendations:  Limitations on Scope of Treatment: Full Scope Treatment  Code Status:  DNR  Prognosis:   days to weeks  Discharge Planning:  To Be Determined  Care plan was discussed with bedside RN and family  Thank you for allowing the Palliative Medicine Team to assist in the care of this patient.  Total time spent:  15 min.     Greater than 50%  of this time was spent counseling and coordinating care related to the above assessment and plan.  Norvel Richards, PA-C Palliative Medicine  Please contact Palliative MedicineTeam phone at [phone] for questions and concerns between 7 am - 7 pm.   Please see AMION for individual provider pager numbers.

## 2017-01-21 NOTE — Progress Notes (Addendum)
ANTICOAGULATION CONSULT NOTE  Pharmacy Consult for Heparin Indication: atrial fibrillation  Allergies  Allergen Reactions  . Entex Lq [Phenylephrine-Guaifenesin]   . Norvasc [Amlodipine Besylate]     Patient Measurements: Height:  (165.1 cm) Weight: 175 lb 14.8 oz (79.8 kg) IBW/kg (Calculated) : 57 Heparin Dosing Weight: 72.6 kg  Vital Signs: Temp: 97.7 F (36.5 C) (09/19 1921) Temp Source: Oral (09/19 1921) BP: 100/64 (09/19 1800) Pulse Rate: 99 (09/19 1800)  Labs:  Recent Labs  01/19/17 0021 01/19/17 8657 01/19/17 1220  01/19/17 1810 01/20/17 0246  01/21/17 0509 01/21/17 1243 01/21/17 1851  HGB 13.7  --   --   --   --  14.5  --  13.5  --   --   HCT 41.5  --   --   --   --  44.9  --  40.8  --   --   PLT 158  --   --   --   --  201  --  143*  --   --   APTT  --   --   --   < > 37* >160*  < > 58* 97* 48*  LABPROT  --   --   --   --  31.2*  --   --   --   --   --   INR  --   --   --   --  3.04  --   --   --   --   --   HEPARINUNFRC  --   --   --   --  >3.60* >3.60*  --  3.22*  --   --   CREATININE 0.88  --   --   --   --  1.42*  --  1.36*  --   --   TROPONINI <0.03 0.03* 0.04*  --  0.06*  --   --   --   --   --   < > = values in this interval not displayed.  Estimated Creatinine Clearance: 34.4 mL/min (A) (by C-G formula based on SCr of 1.36 mg/dL (H)).   Medical History: Past Medical History:  Diagnosis Date  . Adrenal mass (HCC)   . Asthma   . Chronic atrial fibrillation (HCC)    a. CHADS2VASc => 5 (CHF, HTN, age x 2, female); b. on Xarelto  . Chronic systolic CHF (congestive heart failure) (HCC)    a. reported EF 45% in ~ 2015  . Hypertension   . Osteopenia   . Proteinuria   . Sarcoidosis   . Vitamin D deficiency     Assessment: 82 y/o F with a h/o atrial fibrillation on rivaroxaban to transition to heparin due to elevated troponin and worsening EF. Last dose of rivaroxaban was 1700 9/16. Patient currently receiving heparin at 800 units/hr.    Goal of Therapy:  APTT 68-102 Heparin level 0.3-0.7 units/ml Monitor platelets by anticoagulation protocol: Yes   Plan:  Will continue 800 units/hr. Will recheck aPTT at 1900.   Pharmacy will continue to monitor and adjust per consult.   9/19:  APTT @ 19:00 = 48.   Will increase drip rate to 900 units/hr and recheck aPTT 6 hrs after rate change.  09/20 0330 aPTT 78. Continue current regimen. Recheck aPTT, heparin level, and CBC with tomorrow AM labs.  Robbins,Jason D 01/21/2017,7:48 PM

## 2017-01-21 NOTE — Progress Notes (Signed)
Patient Name: Julie Tate Date of Encounter: 01/21/2017  Primary Cardiologist: New to [facility] - consult by [facility] Problem List     Principal Problem:   Acute diastolic CHF (congestive heart failure) (HCC) Active Problems:   Acute and chronic respiratory failure with hypoxia (HCC)   Palliative care encounter   Goals of care, counseling/discussion   Pleural effusion     Subjective   Off BiPAP this morning, on Venturi mask. Renal function improving this morning from bump noted on 9/18. Status post left-sided thoracentesis on 9/18 with 800 mL fluid aspirated. Cytology pending. Consistent with transudate. Remains net positive for the admission.   Inpatient Medications    Scheduled Meds: . chlorhexidine  15 mL Mouth Rinse BID  . furosemide  80 mg Intravenous Once  . mouth rinse  15 mL Mouth Rinse q12n4p  . potassium chloride  40 mEq Oral Once   Continuous Infusions: . heparin 800 Units/hr (01/21/17 0800)  . phenylephrine (NEO-SYNEPHRINE) Adult infusion 6 mcg/min (01/21/17 0828)   PRN Meds: docusate sodium, ipratropium-albuterol, morphine injection   Vital Signs    Vitals:   01/21/17 0600 01/21/17 0700 01/21/17 0739 01/21/17 0800  BP: (!) 109/50 (!) 106/52    Pulse: 97 92 91   Resp: 18 (!) 23 14   Temp:    98.1 F (36.7 C)  TempSrc:    Axillary  SpO2: 91% 99% 91% 92%  Weight:      Height:        Intake/Output Summary (Last 24 hours) at 01/21/17 1040 Last data filed at 01/21/17 1033  Gross per 24 hour  Intake          1226.24 ml  Output             1630 ml  Net          -403.76 ml   Filed Weights   01/19/17 0300 01/20/17 0420 01/21/17 0405  Weight: 166 lb 14.2 oz (75.7 kg) 173 lb 1 oz (78.5 kg) 175 lb 14.8 oz (79.8 kg)    Physical Exam    GEN: Frail and elderly appearing, in no acute distress.  HEENT: Grossly normal.  Neck: Supple, no JVD, carotid bruits, or masses. Cardiac: Irregularly irregular, no murmurs, rubs, or gallops. No clubbing,  cyanosis, 1+ bilateraly pitting edema.  Radials/DP/PT 2+ and equal bilaterally.  Respiratory:  Diminished breath sounds bilaterally with coarse rhonchi. GI: Soft, nontender, nondistended, BS + x 4. MS: no deformity or atrophy. Skin: warm and dry, no rash. Neuro:  Strength and sensation are intact. Psych: Alert  Labs    CBC  Recent Labs  31-Jan-2017 1102  01/20/17 0246 01/21/17 0509  WBC 9.6  < > 11.8* 8.0  NEUTROABS 8.3*  --   --   --   HGB 15.3  < > 14.5 13.5  HCT 45.6  < > 44.9 40.8  MCV 89.7  < > 91.5 92.4  PLT 186  < > 201 143*  < > = values in this interval not displayed. Basic Metabolic Panel  Recent Labs  01/19/17 0632 01/20/17 0246 01/21/17 0509  NA  --  141 145  K  --  4.3 3.8  CL  --  102 105  CO2  --  29 33*  GLUCOSE  --  119* 99  BUN  --  41* 45*  CREATININE  --  1.42* 1.36*  CALCIUM  --  8.7* 8.6*  MG 2.1  --   --  PHOS 5.4*  --   --    Liver Function Tests  Recent Labs  01/31/2017 1102 01/20/17 0246  AST 27 35  ALT 17 45  ALKPHOS 88 78  BILITOT 1.1 1.1  PROT 7.9 7.5  ALBUMIN 4.1 3.9   No results for input(s): LIPASE, AMYLASE in the last 72 hours. Cardiac Enzymes  Recent Labs  01/19/17 0632 01/19/17 1220 01/19/17 1810  TROPONINI 0.03* 0.04* 0.06*   BNP Invalid input(s): POCBNP D-Dimer No results for input(s): DDIMER in the last 72 hours. Hemoglobin A1C  Recent Labs  01/19/17 1220  HGBA1C 6.0*   Fasting Lipid Panel No results for input(s): CHOL, HDL, LDLCALC, TRIG, CHOLHDL, LDLDIRECT in the last 72 hours. Thyroid Function Tests No results for input(s): TSH, T4TOTAL, T3FREE, THYROIDAB in the last 72 hours.  Invalid input(s): FREET3  Telemetry    Afib, 80s to 100s bpm - Personally Reviewed  ECG    n/a - Personally Reviewed  Radiology    Dg Chest Port 1 View  Result Date: 01/21/2017 IMPRESSION: No significant change since prior study. Electronically Signed   By: Charlett Nose M.D.   On: 01/21/2017 08:21   Dg Chest  Port 1 View  Result Date: 01/20/2017 IMPRESSION: No pneumothorax following thoracentesis on the left. Changes of CHF and large right pleural effusion. Electronically Signed   By: Alcide Clever M.D.   On: 01/20/2017 13:44   Dg Chest Port 1 View  Result Date: 01/20/2017 IMPRESSION: Persistent findings of congestive heart failure with bilateral pulmonary edema and bilateral pleural effusions. Pleural effusions have worsened from prior exam. Electronically Signed   By: Maisie Fus  Register   On: 01/20/2017 06:20   US Thoracentesis Asp Pleural Space W/img Guide  Result Date: 01/20/2017 IMPRESSION: Successful ultrasound guided left thoracentesis yielding 800 mL of pleural fluid. Chest X-ray pending. Read by:  Corrin Parker, PA-C Electronically Signed   By: Alcide Clever M.D.   On: 01/20/2017 13:18    Cardiac Studies   TTE 01/19/2017: Study Conclusions  - Left ventricle: The cavity size was normal. Wall thickness was increased in a pattern of mild LVH. Systolic function was vigorous. The estimated ejection fraction was in the range of 65% to 70%. Regional wall motion abnormalities cannot be excluded. - Mitral valve: Moderately thickened leaflets . There was mild regurgitation. - Right ventricle: The cavity size was moderately dilated. Wall thickness was mildly increased. Systolic function was normal. - Right atrium: The atrium was mildly dilated. - Pulmonary arteries: Systolic pressure was severely increased. PA systolic pressure is at least 16-10 mmHg. - Pericardium, extracardiac: A trivial pericardial effusion was identified.  Patient Profile     82 y.o. female with history of chronic combined CHF with prior EF 45% per documentation now with normalized EF by TTE 01/2017, chronic Afib previously on Pradaxa now on Xarelto, sarcoidosis, HTN, asthma, and anxiety/depression who has been noting increased SOB and lower extremity swelling the past 6-12 months, worse the past two days  leading to her admission.  Assessment & Plan    1. Acute respiratory failure with hypoxia and hypercarbia: -Requiring BiPAP, wean as able per PCCM -Likely multifactorial including severe pulmonary hypertension, acute on chronic diastolic CHF, pleural effusions, and AECOPD -Status post left-sided thoracentesis on 9/18 with 800 mL aspirated, transudate -Left atrium upper normal size -May need further thoracentesis   2. Acute on chronic diastolic CHF/pulmonary HTN: -Renal function worsened with diuresis on 9/18, improved this morning -IV Lasix 80 mg ordered by PCCM -  Phenylephrine for MAP > 65 mmHg per PCCM, wean as able  -Hold Coreg given need for pressor support currently -Restart evidence based heart failure regimen as BP allows -TTE as above  3. Elevated troponin: -Likely supply demand ischemia -On heparin gtt in place of Xarelto, continue for now given need for BiPAP -TTE as above  4. Chronic Afib: -Rate controlled -Hold Coreg as above given hypotension -Continue heparin gtt in place of Xarelto as above for uninterrupted anticoagulation  -CHADS2VASc at least 5 (CHF, HTN, age x 2, female)  5. AECOPD: -Per IM  6. Hyperglycemia: -HGB A1c 6.0  7. Dispo: -Guarded prognosis -Palliative care on board  Signed, Carola Frost [facility] Pager: [phone] 01/21/2017, 10:40 AM   Attending Note Patient seen and examined, agree with detailed note above,  Patient presentation and plan discussed on rounds.   Has been by the bedside, Patient on Ventimask, saturations mid to high 90s Desaturation on nasal cannula, unable eat without the setting Opens her eyes to stimulation, nonconversant  On physical exam  supine in bed Ventimask in place, decreased breath sounds at the bases, heart sounds tachycardic, irregular no murmurs appreciated, abdomen soft nondistended, trace pitting edema lower extremities  Lab work reviewed showing creatinine 1.36, BUN 45,  potassium 3.8  A/P: 1) acute respiratory distress - Severe pulmonary hypertension, right-sided pressures 90 mmHg  Would continue aggressive diuresis We will give additional dose of IV Lasix this evening   2) chronic atrial fibrillation Elevated rate likely secondary to respiratory distress  On heparin  Long discussion with nursing, husband at the bedside Greater than 50% was spent in counseling and coordination of care with patient Total encounter time 35 minutes or more   Signed: Dossie Arbour  M.D., Ph.D. [facility]

## 2017-01-22 DIAGNOSIS — Z515 Encounter for palliative care: Secondary | ICD-10-CM

## 2017-01-22 DIAGNOSIS — I509 Heart failure, unspecified: Secondary | ICD-10-CM

## 2017-01-22 LAB — CBC
HEMATOCRIT: 41.9 % (ref 35.0–47.0)
HEMOGLOBIN: 13.7 g/dL (ref 12.0–16.0)
MCH: 30 pg (ref 26.0–34.0)
MCHC: 32.7 g/dL (ref 32.0–36.0)
MCV: 91.7 fL (ref 80.0–100.0)
Platelets: 149 10*3/uL — ABNORMAL LOW (ref 150–440)
RBC: 4.57 MIL/uL (ref 3.80–5.20)
RDW: 17.1 % — AB (ref 11.5–14.5)
WBC: 8.5 10*3/uL (ref 3.6–11.0)

## 2017-01-22 LAB — HEPARIN LEVEL (UNFRACTIONATED): HEPARIN UNFRACTIONATED: 2.26 [IU]/mL — AB (ref 0.30–0.70)

## 2017-01-22 LAB — APTT: APTT: 78 s — AB (ref 24–36)

## 2017-01-22 MED ORDER — LORAZEPAM 2 MG/ML IJ SOLN
0.2500 mg | Freq: Two times a day (BID) | INTRAMUSCULAR | Status: DC
  Administered 2017-01-22 – 2017-01-24 (×4): 0.25 mg via INTRAVENOUS
  Filled 2017-01-22 (×4): qty 1

## 2017-01-22 MED ORDER — LORAZEPAM 2 MG/ML IJ SOLN
0.5000 mg | INTRAMUSCULAR | Status: DC | PRN
  Administered 2017-01-24: 13:00:00 0.5 mg via INTRAVENOUS
  Filled 2017-01-22: qty 1

## 2017-01-22 MED ORDER — SODIUM CHLORIDE 0.9 % IV SOLN: 250.0000 mL | INTRAVENOUS | Status: DC | PRN

## 2017-01-22 MED ORDER — LORAZEPAM 2 MG/ML PO CONC: 0.5000 mg | ORAL | Status: DC | PRN

## 2017-01-22 MED ORDER — POLYVINYL ALCOHOL 1.4 % OP SOLN
1.0000 [drp] | Freq: Four times a day (QID) | OPHTHALMIC | Status: DC | PRN
  Filled 2017-01-22: qty 15

## 2017-01-22 MED ORDER — HALOPERIDOL LACTATE 2 MG/ML PO CONC
0.5000 mg | ORAL | Status: DC | PRN
  Filled 2017-01-22: qty 0.3

## 2017-01-22 MED ORDER — FUROSEMIDE 10 MG/ML IJ SOLN
40.0000 mg | Freq: Once | INTRAMUSCULAR | Status: AC
  Administered 2017-01-22: 40 mg via INTRAVENOUS
  Filled 2017-01-22: qty 4

## 2017-01-22 MED ORDER — GLYCOPYRROLATE 0.2 MG/ML IJ SOLN
0.2000 mg | INTRAMUSCULAR | Status: DC | PRN
  Filled 2017-01-22: qty 1

## 2017-01-22 MED ORDER — HALOPERIDOL LACTATE 5 MG/ML IJ SOLN: 0.5000 mg | INTRAMUSCULAR | Status: DC | PRN

## 2017-01-22 MED ORDER — BIOTENE DRY MOUTH MT LIQD: 15.0000 mL | OROMUCOSAL | Status: DC | PRN

## 2017-01-22 MED ORDER — LORAZEPAM 0.5 MG PO TABS: 0.5000 mg | ORAL_TABLET | ORAL | Status: DC | PRN

## 2017-01-22 MED ORDER — HALOPERIDOL 0.5 MG PO TABS
0.5000 mg | ORAL_TABLET | ORAL | Status: DC | PRN
  Filled 2017-01-22: qty 1

## 2017-01-22 MED ORDER — MORPHINE SULFATE (PF) 2 MG/ML IV SOLN
2.0000 mg | INTRAVENOUS | Status: DC | PRN
  Administered 2017-01-23 – 2017-01-24 (×6): 2 mg via INTRAVENOUS
  Filled 2017-01-22 (×6): qty 1

## 2017-01-22 MED ORDER — GLYCOPYRROLATE 1 MG PO TABS
1.0000 mg | ORAL_TABLET | ORAL | Status: DC | PRN
  Filled 2017-01-22: qty 1

## 2017-01-22 MED ORDER — RIVAROXABAN 15 MG PO TABS: 15.0000 mg | ORAL_TABLET | Freq: Every day | ORAL | Status: DC

## 2017-01-22 MED ORDER — SODIUM CHLORIDE 0.9% FLUSH
3.0000 mL | Freq: Two times a day (BID) | INTRAVENOUS | Status: DC
  Administered 2017-01-22 – 2017-01-24 (×5): 3 mL via INTRAVENOUS

## 2017-01-22 MED ORDER — SODIUM CHLORIDE 0.9% FLUSH: 3.0000 mL | INTRAVENOUS | Status: DC | PRN

## 2017-01-22 MED ORDER — POTASSIUM CL IN DEXTROSE 5% 20 MEQ/L IV SOLN
20.0000 meq | INTRAVENOUS | Status: DC
  Administered 2017-01-22: 20 meq via INTRAVENOUS
  Filled 2017-01-22: qty 1000

## 2017-01-22 NOTE — Progress Notes (Signed)
Family updated on plan to transfer patient to the floor once bed is available- declined comfort cart at this time. Patient resting comfortable and monitor on comfort care per order.

## 2017-01-22 NOTE — Progress Notes (Signed)
ANTICOAGULATION CONSULT NOTE - Initial Consult  Pharmacy Consult for Xarelto Indication: atrial fibrillation  Allergies  Allergen Reactions  . Entex Lq [Phenylephrine-Guaifenesin]   . Norvasc [Amlodipine Besylate]     Patient Measurements: Height:  (165.1 cm) Weight: 172 lb 13.5 oz (78.4 kg) IBW/kg (Calculated) : 57  Vital Signs: Temp: 97.8 F (36.6 C) (09/20 0800) Temp Source: Axillary (09/20 0800) BP: 122/71 (09/20 0400) Pulse Rate: 96 (09/20 0800)  Labs:  Recent Labs  01/19/17 1220  01/19/17 1810  01/20/17 0246  01/21/17 0509 01/21/17 1243 01/21/17 1851 01/22/17 0329  HGB  --   --   --   < > 14.5  --  13.5  --   --  13.7  HCT  --   --   --   --  44.9  --  40.8  --   --  41.9  PLT  --   --   --   --  201  --  143*  --   --  149*  APTT  --   < > 37*  --  >160*  < > 58* 97* 48* 78*  LABPROT  --   --  31.2*  --   --   --   --   --   --   --   INR  --   --  3.04  --   --   --   --   --   --   --   HEPARINUNFRC  --   < > >3.60*  --  >3.60*  --  3.22*  --   --  2.26*  CREATININE  --   --   --   --  1.42*  --  1.36*  --   --   --   TROPONINI 0.04*  --  0.06*  --   --   --   --   --   --   --   < > = values in this interval not displayed.  Estimated Creatinine Clearance: 34.2 mL/min (A) (by C-G formula based on SCr of 1.36 mg/dL (H)).   Medical History: Past Medical History:  Diagnosis Date  . Adrenal mass (HCC)   . Asthma   . Chronic atrial fibrillation (HCC)    a. CHADS2VASc => 5 (CHF, HTN, age x 2, female); b. on Xarelto  . Chronic systolic CHF (congestive heart failure) (HCC)    a. reported EF 45% in ~ 2015  . Hypertension   . Osteopenia   . Proteinuria   . Sarcoidosis   . Vitamin D deficiency     Medications:  9/18 Heparin gtt >> 9/20 9/20 Xarelto >>  Assessment:  Julie Tate is a 89 YOF with known history of asthma, CHF, CKD-stage 3, HTN, Sarcoidosis, adrenal mass, A. Fib that presented to the ED with SOB due to CHF on 9/16. Patient started on heparin  drip due to patient being NPO.   Plan:  Will stop heparin gtt immediately before Xarelto administration. Will resume home dose of Xarelto 15 mg daily.     Dwain Sarna, PharmD Student 01/22/2017,10:45 AM

## 2017-01-22 NOTE — Progress Notes (Signed)
Report given to Lance Bosch, RN- pt to be transferred to 1C

## 2017-01-22 NOTE — Progress Notes (Signed)
[place] responded to consult for "Palliative Care", met with pt and family at bedside. Pt's husband and sister-in-law states that pt will get better soon. [place] allowed the family to lead the conversation and provided support, prayer and pastoral presence. [place] to follow up with pt as needed.    01/22/17 1600  Clinical Encounter Type  Visited With Patient and family together  Visit Type Initial;Follow-up  Referral From Nurse  Consult/Referral To Chaplain  Spiritual Encounters  Spiritual Needs Prayer;Other (Comment)

## 2017-01-22 NOTE — Progress Notes (Signed)
[facility] [place] [facility]   PATIENT NAME: Julie Tate    MR#:  [id number]  DATE OF BIRTH:  [birth year]  SUBJECTIVE patient is medically comfort care. On morphine, Ativan when necessary, and IV Lasix also. She is alert, awake.   CHIEF COMPLAINT:   Chief Complaint  Patient presents with  . Shortness of Breath    REVIEW OF SYSTEMS:    Review of Systems  Constitutional: Negative for chills and fever.  HENT: Negative for hearing loss.   Eyes: Negative for blurred vision, double vision and photophobia.  Respiratory: Positive for shortness of breath. Negative for cough, hemoptysis, sputum production and wheezing.   Cardiovascular: Negative for palpitations, orthopnea and leg swelling.  Gastrointestinal: Negative for abdominal pain, diarrhea and vomiting.  Genitourinary: Negative for dysuria and urgency.  Musculoskeletal: Negative for myalgias and neck pain.  Skin: Negative for rash.  Neurological: Negative for dizziness, focal weakness, seizures, weakness and headaches.  Psychiatric/Behavioral: Negative for memory loss. The patient does not have insomnia.     Nutrition: Tolerating Diet: Tolerating PT:      DRUG ALLERGIES:   Allergies  Allergen Reactions  . Entex Lq [Phenylephrine-Guaifenesin]   . Norvasc [Amlodipine Besylate]     VITALS:  Blood pressure 108/62, pulse 96, temperature 97.6 F (36.4 C), temperature source Axillary, resp. rate 14, height  (1.651 m), weight 78.4 kg (172 lb 13.5 oz), SpO2 95 %.  PHYSICAL EXAMINATION:   Physical Exam  GENERAL:  82 y.o.-year-old patient lying in the bed with no acute distress.  EYES: Pupils equal, round, reactive to light and accommodation. No scleral icterus. Extraocular muscles intact.  HEENT: Head atraumatic, normocephalic. Oropharynx and nasopharynx clear.  NECK:  Supple, no jugular venous distention. No thyroid enlargement, no tenderness.  LUNGS: Decreased breath sounds at  bases.  CARDIOVASCULAR: S1, S2 normal. No murmurs, rubs, or gallops.  ABDOMEN: Soft, nontender, nondistended. Bowel sounds present. No organomegaly or mass.  EXTREMITIES: No pedal edema, cyanosis, or clubbing.  NEUROLOGIC: Cranial nerves II through XII are intact. Muscle strength 5/5 in all extremities. Sensation intact. Gait not checked.  PSYCHIATRIC: The patient is alert and oriented x 3.  SKIN: No obvious rash, lesion, or ulcer.    LABORATORY PANEL:   CBC  Recent Labs Lab 01/22/17 0329  WBC 8.5  HGB 13.7  HCT 41.9  PLT 149*   ------------------------------------------------------------------------------------------------------------------  Chemistries   Recent Labs Lab 01/19/17 0632 01/20/17 0246 01/21/17 0509  NA  --  141 145  K  --  4.3 3.8  CL  --  102 105  CO2  --  29 33*  GLUCOSE  --  119* 99  BUN  --  41* 45*  CREATININE  --  1.42* 1.36*  CALCIUM  --  8.7* 8.6*  MG 2.1  --   --   AST  --  35  --   ALT  --  45  --   ALKPHOS  --  78  --   BILITOT  --  1.1  --    ------------------------------------------------------------------------------------------------------------------  Cardiac Enzymes  Recent Labs Lab 01/19/17 1810  TROPONINI 0.06*   ------------------------------------------------------------------------------------------------------------------  RADIOLOGY:  Dg Chest Port 1 View  Result Date: 01/21/2017 CLINICAL DATA:  Respiratory failure EXAM: PORTABLE CHEST 1 VIEW COMPARISON:  01/20/2017 FINDINGS: Cardiomegaly with vascular congestion. Moderate to large right pleural effusion with diffuse right lung airspace opacity. Mild interstitial prominence throughout the left lung with left base atelectasis or infiltrate. No real  change since prior study. IMPRESSION: No significant change since prior study. Electronically Signed   By: Charlett Nose M.D.   On: 01/21/2017 08:21     ASSESSMENT AND PLAN:   Principal Problem:   Acute diastolic CHF  (congestive heart failure) (HCC) Active Problems:   Acute and chronic respiratory failure with hypoxia (HCC)   Palliative care encounter   Goals of care, counseling/discussion   Pleural effusion   Comfort measures only status   1 acute respiratory failure with hypoxia and hypercarbia secondary to acute on chronic diastolic heart failure: Likely not improving despite IV diuretics, spoke with palliative care, shifted toward comfort measures only. Patient has severe pulmonary hypertension, diastolic heart failure, now on full comfort care measures with daily vitals, morphine, Ativan as needed, #2 hypotension:   3, elevated troponin secondary to demand ischemia: 1. Chronic pleural effusion Atrial fibrillation Chronic kidney disease stage III  #4 CODE STATUS DO NOT RESUSCITATE   ##5 history of depression:   All the records are reviewed and case discussed with Care Management/Social Workerr. Management plans discussed with the patient, family and they are in agreement.  CODE STATUS: DO NOT RESUSCITATE   TOTAL TIME TAKING CARE OF THIS PATIENT:35 minutes.   POSSIBLE D/C IN 3-4 DAYS, DEPENDING ON CLINICAL CONDITION.   Katha Hamming M.D on 01/22/2017 at 2:38 PM  Between 7am to 6pm - Pager - [phone]  After 6pm go to [email or web address] - password [email or web address] [facility]  [facility] [facility]  Office  [phone]  CC: Primary care physician; Steele Sizer, MD

## 2017-01-22 NOTE — Progress Notes (Signed)
Patient had L bowel movement.  Patient transferred to room 112.

## 2017-01-22 NOTE — Plan of Care (Signed)
Problem: Coping: Goal: Ability to identify and develop effective coping behavior will improve Outcome: Progressing Family met with Palliative practitioner today, patient made full comfort today  Problem: Role Relationship: Goal: Ability to verbalize concerns, feelings, and thoughts to partner or family member will improve Outcome: Progressing Patient's intake better today. Patient requesting food - at mashed potates, applesauce and vanilla pudding as tolerated with nectar thick liquids  Problem: Pain Management: Goal: General experience of comfort will improve Outcome: Progressing Patient not expressing any pain at this time ; offered meds for comfort as needed

## 2017-01-22 NOTE — Progress Notes (Signed)
PULMONARY / CRITICAL CARE MEDICINE   Name: Julie Tate MRN: [id number] DOB: 1934-12-31    ADMISSION DATE:  2017/02/13   CONSULTATION DATE:  01/19/2017  PT PROFILE: 80 F with remote history of sarcoidosis initially adm to med-surg with acute hypoxemic respiratory failure and transferred to ICU/SDU 09/17 for NIPPV  Underwent thoracentesis 09/18: 800 cc transudative fluid removed  SUBJECTIVE:  Remains on venturi mask - does not appear to be in distress. Reports subjective dyspnea.  VITAL SIGNS: BP 108/62 (BP Location: Left Arm)   Pulse 96   Temp 97.6 F (36.4 C) (Axillary)   Resp 14   Ht  (1.651 m)   Wt 78.4 kg (172 lb 13.5 oz)   LMP  (LMP Unknown)   SpO2 95%   BMI 28.76 kg/m   HEMODYNAMICS:    VENTILATOR SETTINGS: FiO2 (%):  [40 %-60 %] 45 %  INTAKE / OUTPUT: I/O last 3 completed shifts: In: 756.6 [I.V.:756.6] Out: 2175 [Urine:2175]  PHYSICAL EXAMINATION:  Gen: Frail, NAD on VM HEENT: NCAT, sclerae white Neck: No LAN, JVP cannot be assessed Lungs: dull with bronchial breath sounds in right base, bibasilar crackles, no wheezes  Cardiovascular: IRIR, rate controlled, no M noted Abdomen: Soft, NT, +BS Ext: 2-3+ Symmetric pretibial edema Neuro: No focal deficits, diffusely weak   LABS:  BMET  Recent Labs Lab 01/19/17 0021 01/20/17 0246 01/21/17 0509  NA 141 141 145  K 3.6 4.3 3.8  CL 102 102 105  CO2 33* 29 33*  BUN 26* 41* 45*  CREATININE 0.88 1.42* 1.36*  GLUCOSE 117* 119* 99    Electrolytes  Recent Labs Lab 01/19/17 0021 01/19/17 0632 01/20/17 0246 01/21/17 0509  CALCIUM 8.9  --  8.7* 8.6*  MG  --  2.1  --   --   PHOS  --  5.4*  --   --     CBC  Recent Labs Lab 01/20/17 0246 01/21/17 0509 01/22/17 0329  WBC 11.8* 8.0 8.5  HGB 14.5 13.5 13.7  HCT 44.9 40.8 41.9  PLT 201 143* 149*    Coag's  Recent Labs Lab 01/19/17 1810  01/21/17 1243 01/21/17 1851 01/22/17 0329  APTT 37*  < > 97* 48* 78*  INR 3.04  --   --    --   --   < > = values in this interval not displayed.  Sepsis Markers  Recent Labs Lab 01/19/17 0437  LATICACIDVEN 1.3    ABG  Recent Labs Lab 01/19/17 0500  PHART 7.20*  PCO2ART 88*  PO2ART 94    Liver Enzymes  Recent Labs Lab 2017/02/13 1102 01/20/17 0246  AST 27 35  ALT 17 45  ALKPHOS 88 78  BILITOT 1.1 1.1  ALBUMIN 4.1 3.9    Cardiac Enzymes  Recent Labs Lab 01/19/17 0632 01/19/17 1220 01/19/17 1810  TROPONINI 0.03* 0.04* 0.06*    Glucose  Recent Labs Lab 01/19/17 0245 01/19/17 0321  GLUCAP 136* 133*    CXR: NSC CM, edema pattern, R > L effusions  STUDIES:  Echocardiogram 09/17: LVEF normal, mild LVH, severe PAH    ASSESSMENT  Acute on chronic hypoxemic/hypercarbic respiratory failure. Bilateral pleural effusion - transudates CXR c/w pulmonary edema but echocardiogram without evidence of L sided dysfunction Severe PAH Hypotension - improving AKI, nonoliguric - Cr slightly improved CAF with controlled ventricular rate. DNR/DNI  DISCUSSION: Palliative care team has spent more time with patient and her husband. All agreed to proceed with comfort measures.   PLAN  Comfort measures as ordered per palliative care service Transfer to MedSurg floor - 1C preferred   Billy Fischer, MD PCCM service Mobile [phone] Pager [phone] 01/22/2017 2:52 PM

## 2017-01-22 NOTE — Progress Notes (Signed)
Daily Progress Note   Patient Name: Julie Tate       Date: 01/22/2017 DOB: November 12, 1934  Age: 82 y.o. MRN#: [id number] Attending Physician: Epifanio Lesches, MD Primary Care Physician: Guadalupe Maple, MD Admit Date: 01/17/2017  Reason for Consultation/Follow-up: Establishing goals of care  Subjective: After discussing the patient's situation with the CCM team, I met with the husband Braulio Conte).  He understands that "the doctors can't fix" what's wrong.  We discussed a shift to comfort only measures to allow nature to take its course.  He was deeply saddened but agreed with the shift.    I spoke with the patient's son Sherren Mocha Braulio Conte is his step father) on the phone and explained the shift in care.  We discussed a time frame of only days.  Sherren Mocha was emotional but understanding.   He would like for his mother to be alert as long as possible.  "I can't see her non-responsive again".  I attempted to comfort him by explaining that when she becomes non-responsive again she will not be in distress - she will be comfortable this time.  He asked what the process will look like.  I told him that our focus with be to make her comfortable - and that when her oxygen sats dropped we would give morphine for comfort rather that utilizing the Bipap again.  I explained that eventually she will go to sleep and we will remove the oxygen in an attempt to not prolong the dying process.  He seemed to understand and stated he would return to the hospital this evening to spend time with his mother.   Assessment: 48 yof with d-HF and severe pulmonary HTN.  Now full comfort.   Patient Profile/HPI:  82 y.o.femalewith past medical history of sarcoid, CKD III, CHF (EF 45) Afib, Chronic pleural effusion and an adrenal  masswho was admitted on 9/16/2018with respiratory failure secondary to CHF. She was being treated with bipap. Over night (early morning 9/17) a code blue was called. The patient retained a pulse but she was hypotensive, hypothermic, and her pulse rate was in the 20s.  She received thoracentesis on 9/18 with some relief.  Unfortunately chest xrays have continued to show no improvement.    Length of Stay: 4  Current Medications: Scheduled Meds:  . chlorhexidine  15 mL  Mouth Rinse BID  . LORazepam  0.25 mg Intravenous BID  . mouth rinse  15 mL Mouth Rinse q12n4p  . sodium chloride flush  3 mL Intravenous Q12H    Continuous Infusions: . sodium chloride      PRN Meds: sodium chloride, antiseptic oral rinse, glycopyrrolate **OR** glycopyrrolate **OR** glycopyrrolate, haloperidol **OR** haloperidol **OR** haloperidol lactate, ipratropium-albuterol, LORazepam **OR** LORazepam **OR** LORazepam, morphine injection, polyvinyl alcohol, sodium chloride flush  Physical Exam        Frail elderly female on venti mask, awake, alert, able to answer questions.  Husband at bedside. CV rrr resp no distress on venti mask Abdomen soft NT, ND   Vital Signs: BP 108/62 (BP Location: Left Arm)   Pulse 96   Temp 97.6 F (36.4 C) (Axillary)   Resp 14   Ht 5' 5"  (1.651 m)   Wt 78.4 kg (172 lb 13.5 oz)   LMP  (LMP Unknown)   SpO2 95%   BMI 28.76 kg/m  SpO2: SpO2: 95 % O2 Device: O2 Device: Venturi Mask O2 Flow Rate: O2 Flow Rate (L/min): 10 L/min  Intake/output summary:  Intake/Output Summary (Last 24 hours) at 01/22/17 1417 Last data filed at 01/22/17 1006  Gross per 24 hour  Intake           120.23 ml  Output             1775 ml  Net         -1654.77 ml   LBM: Last BM Date: 01/20/17 Baseline Weight: Weight: 75.8 kg (167 lb 1.7 oz) Most recent weight: Weight: 78.4 kg (172 lb 13.5 oz)       Palliative Assessment/Data:    Flowsheet Rows     Most Recent Value  Intake Tab  Referral  Department  Critical care  Unit at Time of Referral  ICU  Palliative Care Primary Diagnosis  Cardiac  Date Notified  01/19/17  Palliative Care Type  New Palliative care  Reason for referral  Clarify Goals of Care  Date of Admission  01/04/2017  Date first seen by Palliative Care  01/19/17  # of days Palliative referral response time  0 Day(s)  # of days IP prior to Palliative referral  1  Clinical Assessment  Psychosocial & Spiritual Assessment  Palliative Care Outcomes      Patient Active Problem List   Diagnosis Date Noted  . Pleural effusion   . Acute and chronic respiratory failure with hypoxia ([place])   . Palliative care encounter   . Goals of care, counseling/discussion   . Acute diastolic CHF (congestive heart failure) ([place]) 01/14/2017  . Depression, major, single episode, moderate ([place]) 12/01/2016  . Leg edema, left 05/31/2015  . Proteinuria 11/15/2014  . Benign hypertensive renal disease 11/15/2014  . Asthma 11/15/2014  . Osteopenia 11/15/2014  . Atrial fibrillation ([place]) 11/15/2014  . CHF (congestive heart failure) ([place]) 11/15/2014  . CKD (chronic kidney disease), stage III 11/15/2014  . Hypertensive heart and renal disease 11/15/2014    Palliative Care Plan    Recommendations/Plan:  Shifting to full comfort measures today.  Orders have been changed.  Any interventions or medications not related to comfort have been discontinued.  Patient aspirated on thin liquids today - diet is D3 w/ nectar thick.  Will schedule a minimal dose of ativan (0.25 mg BID) for comfort  PRN morphine, ativan, and robinul ordered. - - These will likely need to be scheduled as she declines.   Code Status:  DNR  Prognosis:   Hours - Days given end stage D-HF with pulmonary HTN and acute on chronic respiratory failure.   Discharge Planning:  Anticipated Hospital Death vs possible Hospice House if she stabilizes.  Husband is familiar with [facility] as the patient's mother passed away  there.  Care plan was discussed with CCM, Cardiologist, patient's family.  Thank you for allowing the Palliative Medicine Team to assist in the care of this patient.  Total time spent:  60     Greater than 50%  of this time was spent counseling and coordinating care related to the above assessment and plan.  Florentina Jenny, PA-C Palliative Medicine  Please contact Palliative MedicineTeam phone at [phone] for questions and concerns between 7 am - 7 pm.   Please see AMION for individual provider pager numbers.

## 2017-01-22 NOTE — Progress Notes (Signed)
Nightshift RN Archie Patten, updated on patient and will transition patient to new room on the  floor.

## 2017-01-23 LAB — BODY FLUID CULTURE: CULTURE: NO GROWTH

## 2017-01-23 NOTE — Care Management Important Message (Signed)
Important Message  Patient Details  Name: Julie Tate MRN: [id number] Date of Birth: 01-Nov-1934   Medicare Important Message Given:  Yes    Chapman Fitch, RN 01/23/2017, 12:21 PM

## 2017-01-23 NOTE — Progress Notes (Signed)
[facility] - [place] [facility]   PATIENT NAME: Julie Tate    MR#:  [id number]  DATE OF BIRTH:  26-Jun-1934  SUBJECTIVE patient is  On comfort care.unresponsive.family is at bedside.  CHIEF COMPLAINT:   Chief Complaint  Patient presents with  . Shortness of Breath    REVIEW OF SYSTEMS:    Review of Systems  Unable to perform ROS: Acuity of condition  Constitutional: Negative for chills and fever.  HENT: Negative for hearing loss.   Eyes: Negative for blurred vision, double vision and photophobia.  Respiratory: Positive for shortness of breath. Negative for cough, hemoptysis, sputum production and wheezing.   Cardiovascular: Negative for palpitations, orthopnea and leg swelling.  Gastrointestinal: Negative for abdominal pain, diarrhea and vomiting.  Genitourinary: Negative for dysuria and urgency.  Musculoskeletal: Negative for myalgias and neck pain.  Skin: Negative for rash.  Neurological: Negative for dizziness, focal weakness, seizures, weakness and headaches.  Psychiatric/Behavioral: Negative for memory loss. The patient does not have insomnia.     Nutrition: Tolerating Diet: Tolerating PT:      DRUG ALLERGIES:   Allergies  Allergen Reactions  . Entex Lq [Phenylephrine-Guaifenesin]   . Norvasc [Amlodipine Besylate]     VITALS:  Blood pressure 123/70, pulse 67, temperature 97.8 F (36.6 C), temperature source Oral, resp. rate (!) 22, height  (1.651 m), weight 78.4 kg (172 lb 13.5 oz), SpO2 95 %.  PHYSICAL EXAMINATION:   Physical Exam  GENERAL:  82 y.o.-year-old patient lying in the bed with no acute distress. Has ventimask  On. EYES: Pupils equal, round, reactive to light HEENT: Head atraumatic, normocephalic. Oropharynx and nasopharynx clear.  NECK:  Supple, no jugular venous distention. No thyroid enlargement, no tenderness.  LUNGS: Decreased breath sounds at bases.  CARDIOVASCULAR: S1, S2 normal. No murmurs, rubs,  or gallops.  ABDOMEN: Soft, nontender, nondistended. Bowel sounds present. No organomegaly or mass.  EXTREMITIES: No pedal edema, cyanosis, or clubbing.  NEUROLOGIC: unresponsive.  PSYCHIATRIC: The patient is unresponsive.  SKIN: No obvious rash, lesion, or ulcer.    LABORATORY PANEL:   CBC  Recent Labs Lab 01/22/17 0329  WBC 8.5  HGB 13.7  HCT 41.9  PLT 149*   ------------------------------------------------------------------------------------------------------------------  Chemistries   Recent Labs Lab 01/19/17 0632 01/20/17 0246 01/21/17 0509  NA  --  141 145  K  --  4.3 3.8  CL  --  102 105  CO2  --  29 33*  GLUCOSE  --  119* 99  BUN  --  41* 45*  CREATININE  --  1.42* 1.36*  CALCIUM  --  8.7* 8.6*  MG 2.1  --   --   AST  --  35  --   ALT  --  45  --   ALKPHOS  --  78  --   BILITOT  --  1.1  --    ------------------------------------------------------------------------------------------------------------------  Cardiac Enzymes  Recent Labs Lab 01/19/17 1810  TROPONINI 0.06*   ------------------------------------------------------------------------------------------------------------------  RADIOLOGY:  No results found.   ASSESSMENT AND PLAN:   Principal Problem:   Acute diastolic CHF (congestive heart failure) (HCC) Active Problems:   Acute and chronic respiratory failure with hypoxia (HCC)   Palliative care encounter   Goals of care, counseling/discussion   Pleural effusion   Comfort measures only status   1 acute respiratory failure with hypoxia and hypercarbia secondary to acute on chronic diastolic heart failure: Likely not improving despite IV diuretics, spoke with palliative care,  shifted toward comfort measures only. Patient has severe pulmonary hypertension, diastolic heart failure, now on full comfort care measures with daily vitals, morphine, Ativan as needed, #2 hypotension:   3, elevated troponin secondary to demand  ischemia: 1. Chronic pleural effusion Atrial fibrillation Chronic kidney disease stage III  #4 CODE STATUS DO NOT RESUSCITATE   ##5 history of depression:   All the records are reviewed and case discussed with Care Management/Social Workerr. Management plans discussed with the patient, family and they are in agreement.  CODE STATUS: DO NOT RESUSCITATE   TOTAL TIME TAKING CARE OF THIS PATIENT:35 minutes.   POSSIBLE D/C IN 3-4 DAYS, DEPENDING ON CLINICAL CONDITION.   Katha Hamming M.D on 01/23/2017 at 2:15 PM  Between 7am to 6pm - Pager - [phone]  After 6pm go to [email or web address] - password [email or web address] [facility]  [facility]  Office  [phone]  CC: Primary care physician; Steele Sizer, MD

## 2017-01-23 NOTE — Progress Notes (Signed)
Daily Progress Note   Patient Name: Julie Tate       Date: 01/23/2017 DOB: [birth year]  Age: 82 y.o. MRN#: [id number] Attending Physician: Epifanio Lesches, MD Primary Care Physician: Guadalupe Maple, MD Admit Date: 01/20/2017  Reason for Consultation/Follow-up: Establishing goals of care, terminal care  Subjective: Ms. Peaden is unresponsive. Family at bedside.   Length of Stay: 5  Current Medications: Scheduled Meds:  . chlorhexidine  15 mL Mouth Rinse BID  . LORazepam  0.25 mg Intravenous BID  . mouth rinse  15 mL Mouth Rinse q12n4p  . sodium chloride flush  3 mL Intravenous Q12H    Continuous Infusions: . sodium chloride      PRN Meds: sodium chloride, antiseptic oral rinse, glycopyrrolate **OR** glycopyrrolate **OR** glycopyrrolate, haloperidol **OR** haloperidol **OR** haloperidol lactate, ipratropium-albuterol, LORazepam **OR** LORazepam **OR** LORazepam, morphine injection, polyvinyl alcohol, sodium chloride flush  Physical Exam  Constitutional: She appears well-developed.  Grimacing   HENT:  Head: Normocephalic and atraumatic.  Cardiovascular: An irregularly irregular rhythm present.  Pulmonary/Chest: No accessory muscle usage. No apnea and no tachypnea. No respiratory distress. She has decreased breath sounds.  Shallow breathes  Abdominal: Soft. Normal appearance.  Neurological: She is unresponsive.  Nursing note and vitals reviewed.           Vital Signs: BP 123/70 (BP Location: Left Arm)   Pulse 67   Temp 97.8 F (36.6 C) (Oral)   Resp (!) 22   Ht 5' 5" (1.651 m)   Wt 78.4 kg (172 lb 13.5 oz)   LMP  (LMP Unknown)   SpO2 95%   BMI 28.76 kg/m  SpO2: SpO2: 95 % O2 Device: O2 Device: Venturi Mask O2 Flow Rate: O2 Flow Rate (L/min): 10  L/min  Intake/output summary:  Intake/Output Summary (Last 24 hours) at 01/23/17 1242 Last data filed at 01/23/17 0950  Gross per 24 hour  Intake              240 ml  Output              900 ml  Net             -660 ml   LBM: Last BM Date: 01/22/17 Baseline Weight: Weight: 75.8 kg (167 lb 1.7 oz) Most recent weight: Weight: 78.4 kg (172 lb  13.5 oz)       Palliative Assessment/Data: 10%   Flowsheet Rows     Most Recent Value  Intake Tab  Referral Department  Critical care  Unit at Time of Referral  ICU  Palliative Care Primary Diagnosis  Cardiac  Date Notified  01/19/17  Palliative Care Type  New Palliative care  Reason for referral  Clarify Goals of Care  Date of Admission  01/06/2017  Date first seen by Palliative Care  01/19/17  # of days Palliative referral response time  0 Day(s)  # of days IP prior to Palliative referral  1  Clinical Assessment  Psychosocial & Spiritual Assessment  Palliative Care Outcomes      Patient Active Problem List   Diagnosis Date Noted  . Comfort measures only status   . Pleural effusion   . Acute and chronic respiratory failure with hypoxia (HCC)   . Palliative care encounter   . Goals of care, counseling/discussion   . Acute diastolic CHF (congestive heart failure) (HCC) 01/05/2017  . Depression, major, single episode, moderate (HCC) 12/01/2016  . Leg edema, left 05/31/2015  . Proteinuria 11/15/2014  . Benign hypertensive renal disease 11/15/2014  . Asthma 11/15/2014  . Osteopenia 11/15/2014  . Atrial fibrillation (HCC) 11/15/2014  . CHF (congestive heart failure) (HCC) 11/15/2014  . CKD (chronic kidney disease), stage III 11/15/2014  . Hypertensive heart and renal disease 11/15/2014    Palliative Care Assessment & Plan   HPI: 82 y.o.femalewith past medical history of sarcoid, CKD III, CHF (EF 45) Afib, Chronic pleural effusion and an adrenal masswho was admitted on 09/08/2018with respiratory failure secondary to CHF. She  was being treated with bipap. Over night (early morning 9/17) a code blue was called. The patient retained a pulse but she was hypotensive, hypothermic, and her pulse rate was in the 20s. She received thoracentesis on 9/18 with some relief.  Unfortunately chest xrays have continued to show no improvement.   Assessment: I met with son and sister-in-law at bedside. They confirm goal of comfort care. She is slightly labored so will ask RN to provide more medication to ensure comfort. She is unresponsive and not waking now. Explained to family that time appears to be coming close and I would not recommend transfer to hospice facility but for Ms. Postlethwait to remain here. I do not believe that she would tolerate transfer. Anticipate hospital death. Emotional support provided to family at bedside.   Recommendations/Plan:  Pain/dyspnea: Morphine 2-4 mg IV prn.   Anxiety: Ativan 0.25 mg IV BID. Ativan 0.5 mg IV every 4 hours prn.   Robinul prn secretions.   Goals of Care and Additional Recommendations:  Limitations on Scope of Treatment: Full Comfort Care  Code Status:  DNR  Prognosis:   Hours - Days  Discharge Planning:  Anticipated Hospital Death   Thank you for allowing the Palliative Medicine Team to assist in the care of this patient.   Total Time 20 min Prolonged Time Billed  no       Greater than 50%  of this time was spent counseling and coordinating care related to the above assessment and plan.   , NP Palliative Medicine Team Pager # [phone] (M-F 8a-5p) Team Phone # [phone] (Nights/Weekends)      

## 2017-01-27 ENCOUNTER — Ambulatory Visit: Payer: Medicare Other | Admitting: Family

## 2017-02-02 NOTE — Death Summary Note (Signed)
DEATH SUMMARY   Patient Details  Name: Julie Tate MRN: [id number] DOB: 09-08-1934  Admission/Discharge Information   Admit Date:  01-Feb-2017  Date of Death: Date of Death: 02-07-17  Time of Death: Time of Death: 09/17/03  Length of Stay: 6  Referring Physician: Steele Sizer, MD   Reason(s) for Hospitalization  CHF, Respiratory Failure.   Diagnoses  Preliminary cause of death:  Secondary Diagnoses (including complications and co-morbidities):  Principal Problem:   Acute diastolic CHF (congestive heart failure) (HCC) Active Problems:   Acute and chronic respiratory failure with hypoxia (HCC)   Palliative care encounter   Goals of care, counseling/discussion   Pleural effusion   Comfort measures only status   Brief Hospital Course (including significant findings, care, treatment, and services provided and events leading to death)  Julie Tate is a 82 y.o. year old female with past medical history of sarcoidosis, chronic diastolic CHF, chronic atrial fibrillation, hypertension, osteoporosis, history of an adrenal mass who presented to the hospital due to shortness of breath and noted to be in congestive heart failure.  1. Congestive heart failure-acute on chronic diastolic CHF 2. Acute respiratory failure with hypoxia and hypercapnia-secondary to acute on chronic diastolic CHF 3. Sarcoidosis  4. Chronic A. Fib.  5. CKD Stage III 6. Depression  Pt. Was not improving with aggressive therapy and therefore Palliative Care consult was obtained and they discussed goals of care with pt's family and Patient was made comfort care only.  - Patient was started on a morphine drip along with Ativan as needed along with glycopyrrolate.  Patient passed away on the late afternoon of 2017-02-07.  Family was made aware.    Pertinent Labs and Studies  Significant Diagnostic Studies Dg Chest Port 1 View  Result Date: 01/21/2017 CLINICAL DATA:  Respiratory failure EXAM: PORTABLE  CHEST 1 VIEW COMPARISON:  01/20/2017 FINDINGS: Cardiomegaly with vascular congestion. Moderate to large right pleural effusion with diffuse right lung airspace opacity. Mild interstitial prominence throughout the left lung with left base atelectasis or infiltrate. No real change since prior study. IMPRESSION: No significant change since prior study. Electronically Signed   By: Charlett Nose M.D.   On: 01/21/2017 08:21   Dg Chest Port 1 View  Result Date: 01/20/2017 CLINICAL DATA:  Status post left thoracentesis EXAM: PORTABLE CHEST 1 VIEW COMPARISON:  01/20/2017 FINDINGS: Reduction in left pleural effusion is noted. No pneumothorax is noted. Cardiac shadow remains enlarged. Large right-sided pleural effusion remains as well. Some patchy changes are seen in the right lung stable from the prior exam. Changes of congestive failure are again noted as well. IMPRESSION: No pneumothorax following thoracentesis on the left. Changes of CHF and large right pleural effusion. Electronically Signed   By: Alcide Clever M.D.   On: 01/20/2017 13:44   Dg Chest Port 1 View  Result Date: 01/20/2017 CLINICAL DATA:  Respiratory failure. EXAM: PORTABLE CHEST 1 VIEW COMPARISON:  01/19/2017. FINDINGS: Cardiomegaly with diffuse bilateral pulmonary infiltrates consistent pulmonary edema. Bilateral pleural effusions again noted. Pleural effusions have worsened from prior exam . Left costophrenic angle not completely imaged. Findings most consistent CHF. IMPRESSION: Persistent findings of congestive heart failure with bilateral pulmonary edema and bilateral pleural effusions. Pleural effusions have worsened from prior exam. Electronically Signed   By: Maisie Fus  Register   On: 01/20/2017 06:20   Dg Chest Port 1 View  Result Date: 01/19/2017 CLINICAL DATA:  Acute respiratory failure EXAM: PORTABLE CHEST 1 VIEW COMPARISON:  02-01-17 FINDINGS: Cardiomegaly. Diffuse  bilateral airspace disease noted, right greater than left, slightly  increased since prior study. Bilateral effusions, also greater on the right, stable since prior study. IMPRESSION: Diffuse bilateral airspace disease slightly worsened since prior study. Small effusions. Electronically Signed   By: Charlett Nose M.D.   On: 01/19/2017 07:45   Dg Chest Portable 1 View  Result Date: 01-30-2017 CLINICAL DATA:  Shortness of breath. History CHF. kidney disease. Hypertension. EXAM: PORTABLE CHEST 1 VIEW COMPARISON:  01/02/2014 FINDINGS: Osteopenia. Midline trachea. Cardiomegaly accentuated by AP portable technique. Atherosclerosis in the transverse aorta. Small, right larger than left pleural effusions. No pneumothorax. Moderate interstitial edema. Right worse than left base airspace disease. IMPRESSION: Moderate congestive heart failure. Right greater than left pleural effusions with adjacent airspace disease. This is most likely atelectasis. Concurrent infection or aspiration cannot be excluded. Aortic Atherosclerosis (ICD10-I70.0). Electronically Signed   By: Jeronimo Greaves M.D.   On: 01-30-2017 11:22   US Thoracentesis Asp Pleural Space W/img Guide  Result Date: 01/20/2017 INDICATION: Congestive heart failure. Respiratory failure requiring BiPAP. Left pleural effusion. Request for diagnostic and therapeutic thoracentesis. EXAM: ULTRASOUND GUIDED LEFT THORACENTESIS MEDICATIONS: 1% Lidocaine = 10 mL COMPLICATIONS: None immediate. PROCEDURE: An ultrasound guided thoracentesis was thoroughly discussed with the patient and questions answered. The benefits, risks, alternatives and complications were also discussed. The patient understands and wishes to proceed with the procedure. Written consent was obtained. Ultrasound was performed to localize and mark an adequate pocket of fluid in the left chest. The area was then prepped and draped in the normal sterile fashion. 1% Lidocaine was used for local anesthesia. Under ultrasound guidance a 6 Fr Safe-T-Centesis catheter was introduced.  Thoracentesis was performed. The catheter was removed and a dressing applied. FINDINGS: A total of approximately 800 mL of clear amber fluid was removed. Samples were sent to the laboratory as requested by the clinical team. IMPRESSION: Successful ultrasound guided left thoracentesis yielding 800 mL of pleural fluid. Chest X-ray pending. Read by:  Corrin Parker, PA-C Electronically Signed   By: Alcide Clever M.D.   On: 01/20/2017 13:18    Microbiology Recent Results (from the past 240 hour(s))  MRSA PCR Screening     Status: None   Collection Time: 01/19/17  3:20 AM  Result Value Ref Range Status   MRSA by PCR NEGATIVE NEGATIVE Final    Comment:        The GeneXpert MRSA Assay (FDA approved for NASAL specimens only), is one component of a comprehensive MRSA colonization surveillance program. It is not intended to diagnose MRSA infection nor to guide or monitor treatment for MRSA infections.   Body fluid culture     Status: None   Collection Time: 01/20/17 12:20 PM  Result Value Ref Range Status   Specimen Description PLEURAL  Final   Special Requests NONE  Final   Gram Stain   Final    MODERATE WBC PRESENT, PREDOMINANTLY MONONUCLEAR NO ORGANISMS SEEN    Culture   Final    NO GROWTH 3 DAYS Performed at [facility], [address] [place], [place] [address]    Report Status 01/23/2017 FINAL  Final    Lab Basic Metabolic Panel:  Recent Labs Lab 01/21/17 0509  NA 145  K 3.8  CL 105  CO2 33*  GLUCOSE 99  BUN 45*  CREATININE 1.36*  CALCIUM 8.6*   Liver Function Tests: No results for input(s): AST, ALT, ALKPHOS, BILITOT, PROT, ALBUMIN in the last 168 hours. No results for  input(s): LIPASE, AMYLASE in the last 168 hours. No results for input(s): AMMONIA in the last 168 hours. CBC:  Recent Labs Lab 01/21/17 0509 01/22/17 0329  WBC 8.0 8.5  HGB 13.5 13.7  HCT 40.8 41.9  MCV 92.4 91.7  PLT 143* 149*   Cardiac Enzymes: No results for input(s): CKTOTAL,  CKMB, CKMBINDEX, TROPONINI in the last 168 hours. Sepsis Labs:  Recent Labs Lab 01/21/17 0509 01/22/17 0329  WBC 8.0 8.5    Procedures/Operations  None   Annelle Behrendt J 01/27/2017, 2:50 PM

## 2017-02-02 NOTE — Progress Notes (Signed)
[facility] [place] [facility]   PATIENT NAME: Julie Tate    MR#:  [id number]  DATE OF BIRTH:  August 15, 1934  SUBJECTIVE:   Patient here due to shortness of breath secondary to CHF.  He was not improving with aggressive therapy and therefore made comfort care only yesterday. Remains under comfort care presently. Patient's husband is at bedside and questions answered.  REVIEW OF SYSTEMS:    Review of Systems  Unable to perform ROS: Mental acuity    Nutrition: Dysphagia 3, nectar thick Tolerating Diet: NO  DRUG ALLERGIES:   Allergies  Allergen Reactions  . Entex Lq [Phenylephrine-Guaifenesin]   . Norvasc [Amlodipine Besylate]     VITALS:  Blood pressure (!) 112/46, pulse 67, temperature (!) 96 F (35.6 C), temperature source Axillary, resp. rate 16, height  (1.651 m), weight 78.4 kg (172 lb 13.5 oz), SpO2 97 %.  PHYSICAL EXAMINATION:   Physical Exam  GENERAL:  82 y.o.-year-old patient lying in bed encephalopathic but comfortable.  EYES: Pupils equal, round, reactive to light and accommodation. No scleral icterus. Extraocular muscles intact.  HEENT: Head atraumatic, normocephalic. Oropharynx and nasopharynx clear.  NECK:  Supple, no jugular venous distention. No thyroid enlargement, no tenderness.  LUNGS: Normal breath sounds bilaterally, no wheezing, rales, rhonchi. No use of accessory muscles of respiration.  CARDIOVASCULAR: S1, S2 normal. No murmurs, rubs, or gallops.  ABDOMEN: Soft, nontender, nondistended. Bowel sounds present. No organomegaly or mass.  EXTREMITIES: No cyanosis, clubbing or edema b/l.    NEUROLOGIC: Cranial nerves II through XII are intact. No focal Motor or sensory deficits b/l.  Encephalopathic.  PSYCHIATRIC: The patient is alert and oriented x 1.  SKIN: No obvious rash, lesion, or ulcer.    LABORATORY PANEL:   CBC  Recent Labs Lab 01/22/17 0329  WBC 8.5  HGB 13.7  HCT 41.9  PLT 149*    ------------------------------------------------------------------------------------------------------------------  Chemistries   Recent Labs Lab 01/19/17 0632 01/20/17 0246 01/21/17 0509  NA  --  141 145  K  --  4.3 3.8  CL  --  102 105  CO2  --  29 33*  GLUCOSE  --  119* 99  BUN  --  41* 45*  CREATININE  --  1.42* 1.36*  CALCIUM  --  8.7* 8.6*  MG 2.1  --   --   AST  --  35  --   ALT  --  45  --   ALKPHOS  --  78  --   BILITOT  --  1.1  --    ------------------------------------------------------------------------------------------------------------------  Cardiac Enzymes  Recent Labs Lab 01/19/17 1810  TROPONINI 0.06*   ------------------------------------------------------------------------------------------------------------------  RADIOLOGY:  No results found.   ASSESSMENT AND PLAN:   82 year old female with past medical history of sarcoidosis, chronic diastolic CHF, chronic atrial fibrillation, hypertension, osteoporosis, history of an adrenal mass who presented to the hospital due to shortness of breath and noted to be in congestive heart failure.  1. Congestive heart failure-acute on chronic diastolic CHF 2. Acute respiratory failure with hypoxia and hypercapnia-secondary to acute on chronic diastolic CHF 3. Sarcoidosis  4. Chronic A. Fib.  5. CKD Stage III 6. Depression  Pt. Was not improving with aggressive therapy and therefore Palliative Care consult was obtained and they discussed goals of care with pt's family and pt. Is COMFORT MEASURES ONLY now.   - cont. morphine, Ativan, glycopyrrolate. -Discussed with patient's husband and he is open to discharge to hospice home. Discussed  with social work and possible discharge to hospice home tomorrow.     All the records are reviewed and case discussed with Care Management/Social Worker. Management plans discussed with the patient, family and they are in agreement.  CODE STATUS: DO NOT  RESUSCITATE/comfort care  DVT Prophylaxis: Ted's  TOTAL TIME TAKING CARE OF THIS PATIENT: 30 minutes.   POSSIBLE D/C IN 1-2 DAYS to Hospice Home.    Houston Siren M.D on 01/05/2017 at 12:47 PM  Between 7am to 6pm - Pager - [phone]  After 6pm go to [email or web address] - Social research officer, government  [facility]  Office  [phone]  CC: Primary care physician; Steele Sizer, MD

## 2017-02-02 NOTE — Progress Notes (Signed)
[place] received a page to provide emotional and grief support for a family of a pt. CH met with family at the deceased's bedside. Provided comfort to the husband, son, and many family members. Husband stated that their pastor was on his way. [place] provided emotional support, grief support, pastoral presence and silence prayer.    Feb 03, 2017 1400  Clinical Encounter Type  Visited With Patient and family together  Visit Type Follow-up;Death;Other (Comment)  Referral From Nurse  Consult/Referral To Chaplain  Spiritual Encounters  Spiritual Needs Prayer;Emotional;Grief support

## 2017-02-02 NOTE — Clinical Social Work Note (Signed)
CSW contacted [facility] to inquire about bed availability. CSW will send referral; a bed may become available tomorrow, 01/25/17. CSW is following.  Argentina Ponder, MSW, [facility]ors [phone]

## 2017-02-02 NOTE — Progress Notes (Signed)
Called into pt's room by the family who were verbalizing "she's gone"; pt with no apical pulse nor respirations verified by self and A. Carlynn Purl RN; see Post Mortem flowsheet for further documentation

## 2017-02-02 DEATH — deceased

## 2017-02-16 ENCOUNTER — Ambulatory Visit: Payer: Medicare Other | Admitting: Family Medicine
